# Patient Record
Sex: Male | Born: 1970 | Race: White | Hispanic: No | Marital: Married | Smoking: Never smoker
Health system: Southern US, Community
[De-identification: ages and names within clinical notes are randomized; demographics above are authoritative.]

## PROBLEM LIST (undated history)

## (undated) DIAGNOSIS — K200 Eosinophilic esophagitis: Secondary | ICD-10-CM

## (undated) DIAGNOSIS — F32A Depression, unspecified: Secondary | ICD-10-CM

## (undated) DIAGNOSIS — K222 Esophageal obstruction: Secondary | ICD-10-CM

## (undated) DIAGNOSIS — K449 Diaphragmatic hernia without obstruction or gangrene: Secondary | ICD-10-CM

## (undated) DIAGNOSIS — K219 Gastro-esophageal reflux disease without esophagitis: Secondary | ICD-10-CM

## (undated) DIAGNOSIS — I10 Essential (primary) hypertension: Secondary | ICD-10-CM

## (undated) DIAGNOSIS — F329 Major depressive disorder, single episode, unspecified: Secondary | ICD-10-CM

## (undated) HISTORY — DX: Esophageal obstruction: K22.2

## (undated) HISTORY — PX: UPPER GASTROINTESTINAL ENDOSCOPY: SHX188

## (undated) HISTORY — DX: Gastro-esophageal reflux disease without esophagitis: K21.9

## (undated) HISTORY — DX: Essential (primary) hypertension: I10

## (undated) HISTORY — PX: SURGERY SCROTAL / TESTICULAR: SUR1316

## (undated) HISTORY — DX: Eosinophilic esophagitis: K20.0

## (undated) HISTORY — DX: Diaphragmatic hernia without obstruction or gangrene: K44.9

## (undated) HISTORY — DX: Depression, unspecified: F32.A

## (undated) HISTORY — DX: Major depressive disorder, single episode, unspecified: F32.9

---

## 2003-05-21 ENCOUNTER — Encounter: Admission: RE | Admit: 2003-05-21 | Discharge: 2003-05-21 | Payer: Self-pay | Admitting: Family Medicine

## 2003-07-02 ENCOUNTER — Ambulatory Visit (HOSPITAL_COMMUNITY): Admission: RE | Admit: 2003-07-02 | Discharge: 2003-07-02 | Payer: Self-pay | Admitting: Urology

## 2003-07-02 ENCOUNTER — Encounter (INDEPENDENT_AMBULATORY_CARE_PROVIDER_SITE_OTHER): Payer: Self-pay | Admitting: Specialist

## 2003-07-02 ENCOUNTER — Ambulatory Visit (HOSPITAL_BASED_OUTPATIENT_CLINIC_OR_DEPARTMENT_OTHER): Admission: RE | Admit: 2003-07-02 | Discharge: 2003-07-02 | Payer: Self-pay | Admitting: Urology

## 2004-11-21 ENCOUNTER — Ambulatory Visit: Payer: Self-pay | Admitting: Gastroenterology

## 2004-12-29 IMAGING — CT CT ABDOMEN WO/W CM
1 of 2 series · 14 of 32 positions shown, 19 images · IV contrast (omnipaque)
Comparison: none

CLINICAL DATA: Right flank and abdominal pain for several months.  Some constipation.  Contrast codes:  1LR.J and 493.0.  
 CT SCAN OF THE ABDOMEN WITHOUT AND WITH CONTRAST AND CT SCAN OF THE PELVIS WITH CONTRAST   
 Prior to the introduction of intravenous contrast, scans of the upper abdomen show the lower lung fields, liver, gallbladder, hepatic radicals, common bile duct, pancreas, and spleen to be normal.  The adrenal glands, kidneys, and upper ureters appear normal.  Oral contrast has passed throughout the stomach and small bowel into the left colon.  
 The patient was then injected intravenously with 125 ml of Omnipaque 300 and the entire abdomen was then rescanned and showed the liver to be normal in its degree of enhancement.  No abnormal lymph nodes are seen.   The major vessels appear normal.   The kidneys appear normal as do the upper ureters.  
 IMPRESSION
 Normal CT scan of the abdomen without and with intravenous contrast. 
 A series of scans of the pelvis utilizing the same contrast as given for the abdomen are made and show the appendix to be slightly prominent in size without definite inflammation, abscess, or perforation.  The appendix is best seen on image 47 and image 112.  There are no definite abnormal lymph nodes seen.  There are some incompletely opacified loops of small bowel noted but no obstruction, perforation or abscess is seen.  The oral contrast is passed throughout the bowel to the sigmoid colon.  The bladder, distal ureters, and seminal vesicals appear normal.  The pelvis and lower lumbar spine are within normal limits. 
 The appendix is minimally prominent in thickness with a maximum of 9 mm in diameter.  No phlebolith or definite infection is seen.  No free fluid or air is present.  
 This report was called to the office of Dr. Adhuham Maldives and was given to Dankabenko Ibaruri.

[Series 3: routine abdomen · axial · 0.70mm/px · z∈[-350,-10]mm · 14 of 106 slices shown, 19 images]
[im 6/106  soft-tissue]
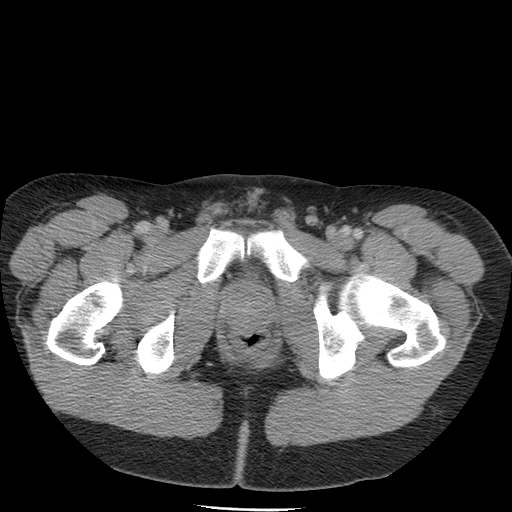
[im 6/106  bone]
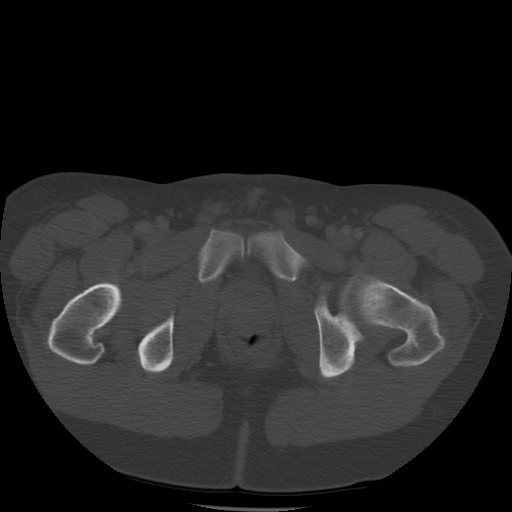
[im 16/106  soft-tissue]
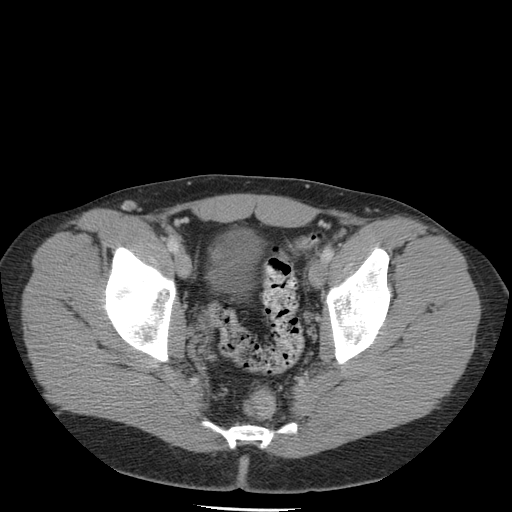
[im 22/106  soft-tissue]
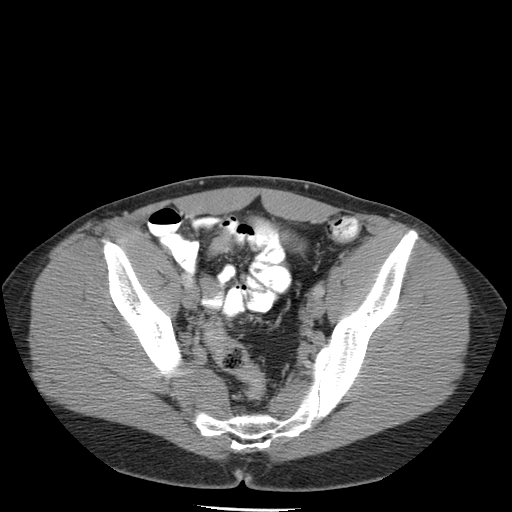
[im 32/106  soft-tissue]
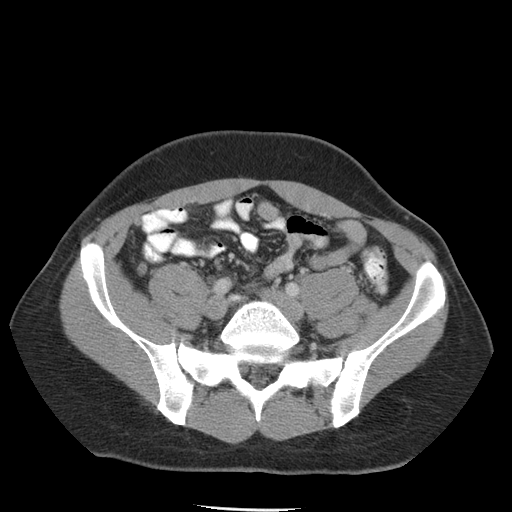
[im 37/106  soft-tissue]
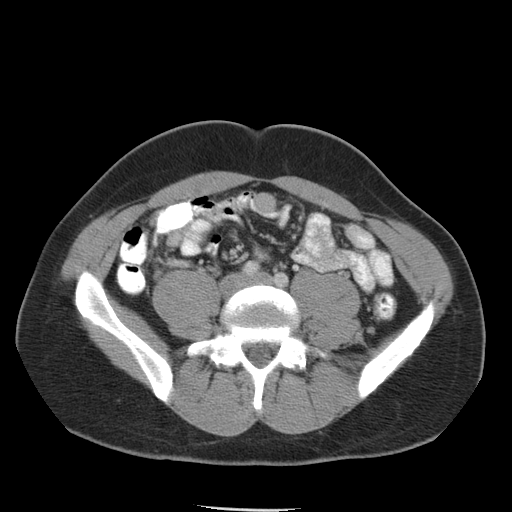
[im 48/106  soft-tissue]
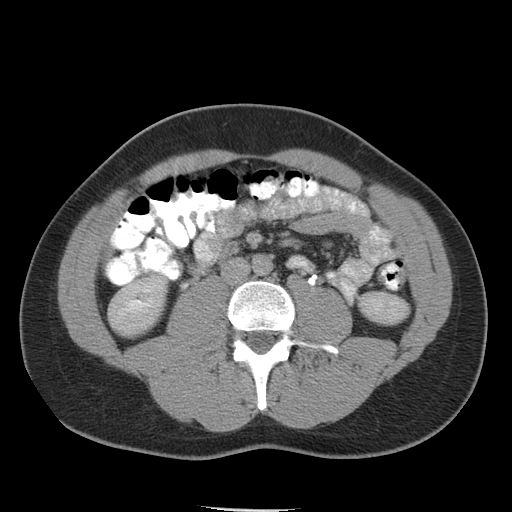
[im 53/106  soft-tissue]
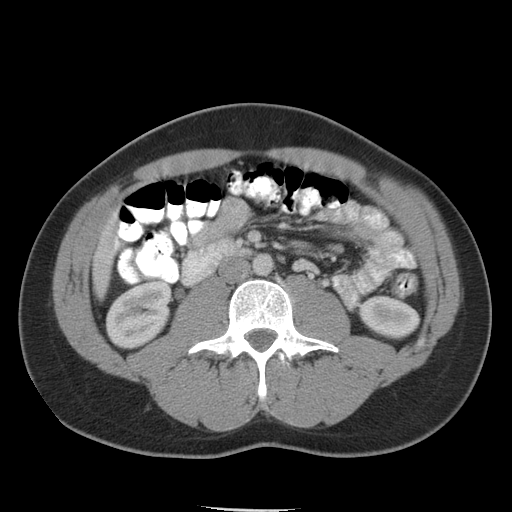
[im 58/106  soft-tissue]
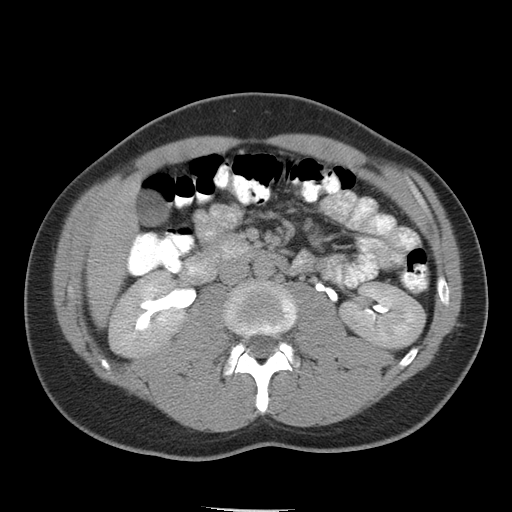
[im 69/106  soft-tissue]
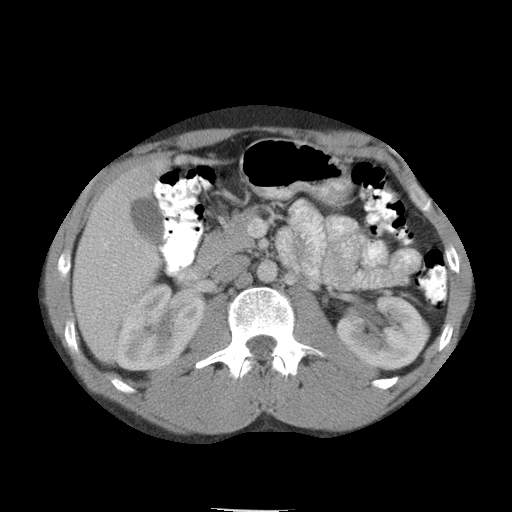
[im 69/106  bone]
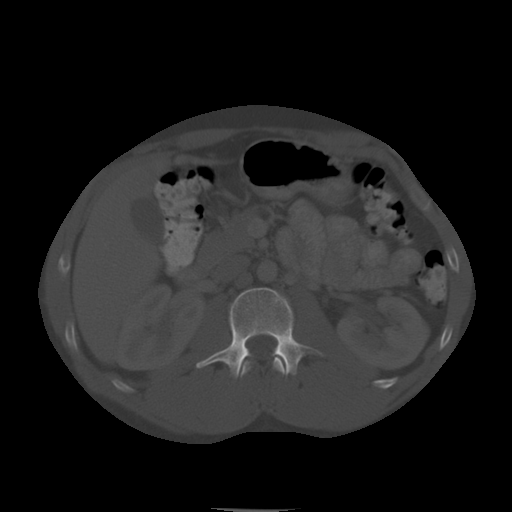
[im 74/106  soft-tissue]
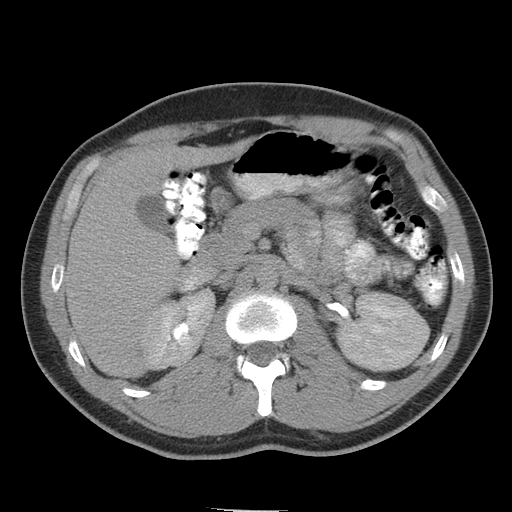
[im 85/106  soft-tissue]
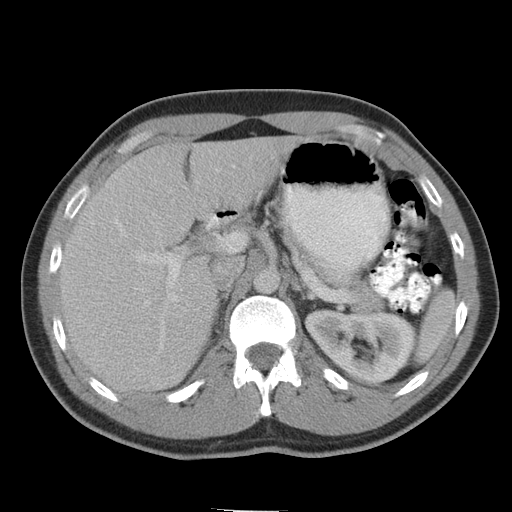
[im 85/106  lung]
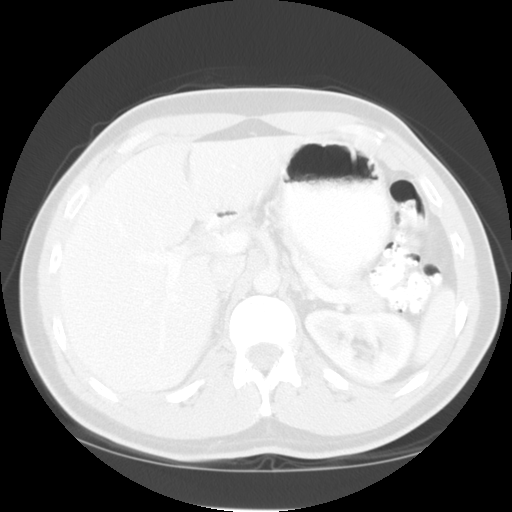
[im 90/106  soft-tissue]
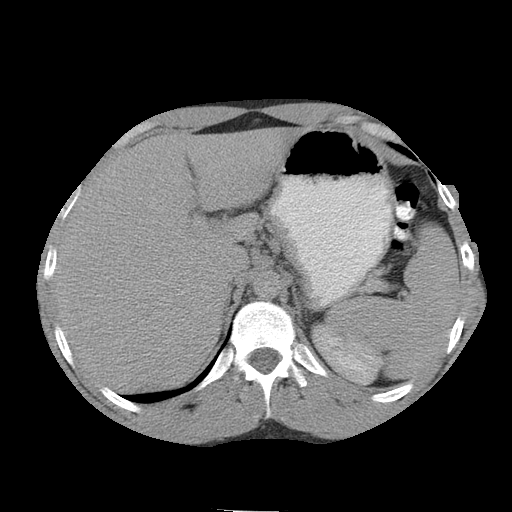
[im 90/106  lung]
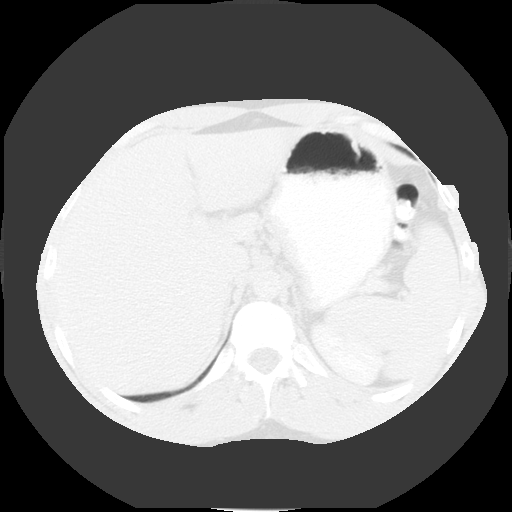
[im 95/106  lung]
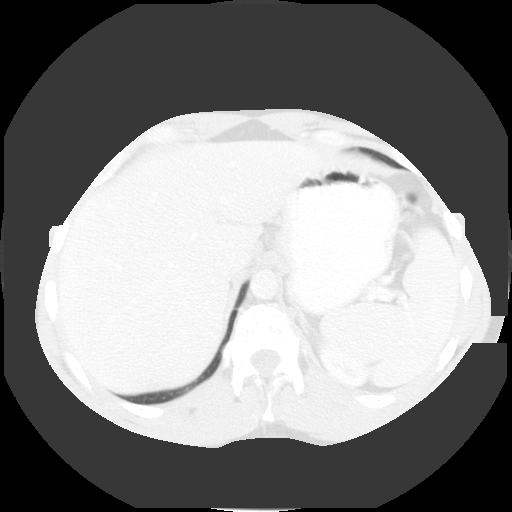
[im 100/106  soft-tissue]
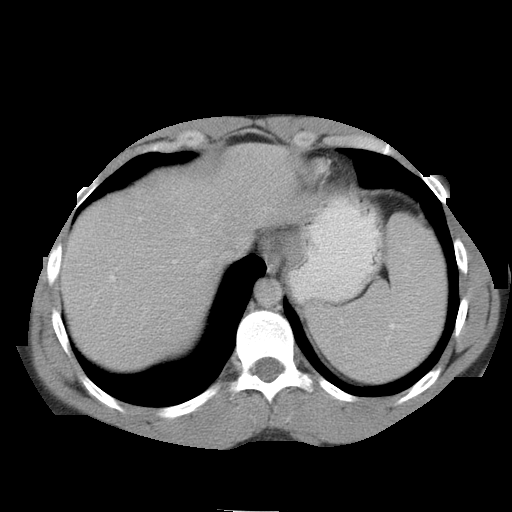
[im 100/106  lung]
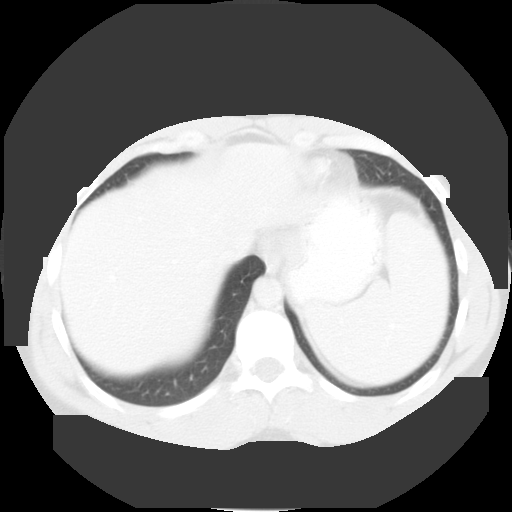

[14 of 32 positions shown; findings below may reference images not displayed]

## 2005-03-11 ENCOUNTER — Ambulatory Visit: Payer: Self-pay | Admitting: Family Medicine

## 2005-12-30 ENCOUNTER — Ambulatory Visit: Payer: Self-pay | Admitting: Gastroenterology

## 2006-03-26 ENCOUNTER — Ambulatory Visit: Payer: Self-pay | Admitting: Family Medicine

## 2007-02-17 DIAGNOSIS — J45909 Unspecified asthma, uncomplicated: Secondary | ICD-10-CM

## 2007-02-25 ENCOUNTER — Ambulatory Visit: Payer: Self-pay | Admitting: Family Medicine

## 2007-02-25 DIAGNOSIS — F329 Major depressive disorder, single episode, unspecified: Secondary | ICD-10-CM

## 2007-02-25 DIAGNOSIS — R030 Elevated blood-pressure reading, without diagnosis of hypertension: Secondary | ICD-10-CM | POA: Insufficient documentation

## 2007-03-01 ENCOUNTER — Ambulatory Visit: Payer: Self-pay | Admitting: Family Medicine

## 2007-03-01 DIAGNOSIS — J069 Acute upper respiratory infection, unspecified: Secondary | ICD-10-CM | POA: Insufficient documentation

## 2007-04-01 ENCOUNTER — Ambulatory Visit: Payer: Self-pay | Admitting: Gastroenterology

## 2007-04-20 ENCOUNTER — Ambulatory Visit: Payer: Self-pay | Admitting: Family Medicine

## 2007-04-20 DIAGNOSIS — E669 Obesity, unspecified: Secondary | ICD-10-CM | POA: Insufficient documentation

## 2007-04-20 DIAGNOSIS — F519 Sleep disorder not due to a substance or known physiological condition, unspecified: Secondary | ICD-10-CM | POA: Insufficient documentation

## 2007-04-21 ENCOUNTER — Encounter (INDEPENDENT_AMBULATORY_CARE_PROVIDER_SITE_OTHER): Payer: Self-pay | Admitting: Internal Medicine

## 2007-04-21 DIAGNOSIS — F911 Conduct disorder, childhood-onset type: Secondary | ICD-10-CM | POA: Insufficient documentation

## 2007-06-02 ENCOUNTER — Ambulatory Visit: Payer: Self-pay | Admitting: Family Medicine

## 2007-06-02 DIAGNOSIS — R21 Rash and other nonspecific skin eruption: Secondary | ICD-10-CM | POA: Insufficient documentation

## 2007-06-02 DIAGNOSIS — J309 Allergic rhinitis, unspecified: Secondary | ICD-10-CM | POA: Insufficient documentation

## 2007-06-16 ENCOUNTER — Encounter (INDEPENDENT_AMBULATORY_CARE_PROVIDER_SITE_OTHER): Payer: Self-pay | Admitting: Internal Medicine

## 2007-07-01 ENCOUNTER — Ambulatory Visit: Payer: Self-pay | Admitting: Licensed Clinical Social Worker

## 2007-07-15 ENCOUNTER — Ambulatory Visit: Payer: Self-pay | Admitting: Licensed Clinical Social Worker

## 2007-07-22 ENCOUNTER — Telehealth: Payer: Self-pay | Admitting: Gastroenterology

## 2007-07-29 ENCOUNTER — Ambulatory Visit: Payer: Self-pay | Admitting: Licensed Clinical Social Worker

## 2007-08-12 ENCOUNTER — Ambulatory Visit: Payer: Self-pay | Admitting: Licensed Clinical Social Worker

## 2007-08-17 ENCOUNTER — Telehealth: Payer: Self-pay | Admitting: Gastroenterology

## 2008-03-30 ENCOUNTER — Ambulatory Visit: Payer: Self-pay | Admitting: Family Medicine

## 2008-05-04 ENCOUNTER — Ambulatory Visit: Payer: Self-pay | Admitting: Family Medicine

## 2008-05-08 ENCOUNTER — Telehealth (INDEPENDENT_AMBULATORY_CARE_PROVIDER_SITE_OTHER): Payer: Self-pay | Admitting: Internal Medicine

## 2009-07-09 ENCOUNTER — Telehealth: Payer: Self-pay | Admitting: Gastroenterology

## 2010-04-15 NOTE — Progress Notes (Signed)
Summary: Schedule recall office visit   Phone Note Outgoing Call Call back at [facility] Phone [phone]   Call placed by: Christie Nottingham CMA Duncan Dull),  July 09, 2009 3:04 PM Call placed to: Patient Summary of Call: left message for pt  to call back and schedule recall office visit.  Initial call taken by: Christie Nottingham CMA Duncan Dull),  July 09, 2009 3:05 PM  Follow-up for Phone Call        left message for pt  to call back and schedule recall office visit. Follow-up by: Christie Nottingham CMA Duncan Dull),  Jul 16, 2009 3:23 PM

## 2010-07-29 NOTE — Assessment & Plan Note (Signed)
[facility]                         GASTROENTEROLOGY OFFICE NOTE   NAME:Corp, JERRARD BRADBURN                       MRN:          [id number]  DATE:04/01/2007                            DOB:          1970-06-22    This is a return office visit for GERD complicated by peptic stricture.  Mr. Mojica relates no complaints.  His reflux symptoms are under  excellent control.  He has had no recurrent episodes of dysphagia.   CURRENT MEDICATIONS:  1. Zoloft 50 mg q. day.  2. Nexium 40 mg q. day.   MEDICATION ALLERGIES:  Amoxicillin leading to a rash.   EXAMINATION:  No acute distress.  Weight:  216.6 pounds.  Blood  pressure:  138/88.  Pulse:  80, regular.  CHEST:  Clear to auscultation bilaterally.  CARDIAC:  Regular rate and rhythm without murmurs.  ABDOMEN:  Soft and nontender with normoactive bowel sounds.  No palpable  organomegaly, masses or hernias.   ASSESSMENT/PLAN:  1. Gastrointestinal reflux disease with a history of a peptic      stricture.  Symptoms under excellent control.  Renew Nexium 40 mg      p.o. q.a.m. taken  30 minutes before breakfast.  2. Return office visit one year.     Venita Lick. Russella Dar, MD, [facility]  Electronically Signed    MTS/MedQ  DD: 04/05/2007  DT: 04/05/2007  Job #: [id number]

## 2010-08-01 NOTE — Op Note (Signed)
NAME:  Dustin Nichols, Dustin Nichols                          ACCOUNT NO.:  [id number]   MEDICAL RECORD NO.:  [id number]                   PATIENT TYPE:  AMB   LOCATION:  [facility]                                 FACILITY:  [facility]   PHYSICIAN:  Sigmund I. Patsi Sears, M.D.         DATE OF BIRTH:  09-13-1970   DATE OF PROCEDURE:  07/02/2003  DATE OF DISCHARGE:                                 OPERATIVE REPORT   PREOPERATIVE DIAGNOSIS:  Left varicocele.   POSTOPERATIVE DIAGNOSIS:  Left varicocele.   PROCEDURE PERFORMED:  Left varicocele ligation.   SURGEON:  Jethro Bolus, MD   ASSISTANT:  Thyra Breed, MD   ANESTHESIA:  General endotracheal.   DRAINS:  None.   ESTIMATED BLOOD LOSS:  10 mL.   INDICATIONS FOR PROCEDURE:  Dustin Nichols is a very pleasant 40 year old male,  seen for a work-up of infertility.  On physical exam, he was found to have a  sizeable left varicocele.  Dustin Nichols was counseled on the premise that this  varicocele may be a factor related to his infertility.  All the risks,  benefits, and alternatives of procedure to repair this were discussed with  the patient.  The patient understands the risk of an internal spermatic vein  ligation and informed consent is obtained.   PROCEDURE IN DETAIL:  Following identification by his arm bracelet, the  patient was brought to the operating room and placed in the supine position.  He then underwent successful induction of general endotracheal anesthesia  and received preoperative IV antibiotics.  His lower abdomen was then  shaved, prepped with Betadine, and draped in the usual sterile fashion.  An  initial incision was made with a 15 blade scalpel just overlying the  superficial inguinal ring.  The incision was approximately 3 cm long.  This  incision was then carried down through the subcutaneous tissue using Bovie  electrocautery.  Scarpa's fascia was then opened with Metzenbaum scissors.  Two [employer] retractors were then used to bluntly  dissect any remaining  subcutaneous fat from the overlying cord.  At this point, the cord was  easily visualized exiting the superficial inguinal ring.  All lateral  attachments including the external spermatic fascia were then dissected off  using right-angle and Bovie electrocautery.  A right-angle clamp was then  easily passed beneath all cord structures.  This was delivered from the  wound.  A quarter-inch Penrose drain was then passed beneath the cord and  hemostats used to affix on either side of the wound, thus elevating the cord  for the remainder of the procedure.  Using two DeBakey forceps, the cord  structures were carefully separated and dissected out.  The vas deferens was  then isolated and moved laterally and then included in the Penrose holding  apparatus.  The large internal spermatic veins were then isolated and  careful dissection was employed until again a right-angle clamp could be  passed underneath each  vein.  The veins were then ligated, both proximally  and distally with an intervening 1 cm segment.  The suture used for the  ligation was a 3-0 silk.  The intervening vein was then sent for pathologic  analysis.  This procedure was repeated for the two remaining internal  spermatic veins which were largely dilated.  Any remaining bleeding was  controlled with a needle-tip Bovie.  Once we were assured that excellent  hemostasis was obtained, the Penrose drain was removed from beneath the cord  and the cord allowed to retract back in its natural position.  Again, the  superficial inguinal ring was not opened and no repair necessary.  We then  irrigated the wound and, again, any remaining bleeding was controlled with  Bovie electrocautery.  Excellent hemostasis had been obtained.  We then  closed the Scarpa's fascia and deep subcutaneous tissues with running 2-0  Vicryl suture.  A second layer of 2-0 Vicryl suture was then used to  reapproximate the remaining  subcutaneous tissues as well as the dermis.  Surgical clips were then used to reapproximate the skin.  The wound was then  cleaned and dried.  A sterile gauze was then applied on top of the surgical  clips followed by Tegaderm dressing.  This marked termination of the  procedure.  The patient tolerated the procedure well, and there were no  complications.  For a full summary, please note that Dr. Patsi Sears was  present and participated in the entire procedure as he was the responsible  surgeon.   DISPOSITION:  After awaking from general anesthesia, the patient was  transported to the postanesthesia care unit in stable condition.  From here,  he would be discharged to home with a follow up appointment scheduled for  staple removal.     Thyra Breed, MD                            Sigmund I. Patsi Sears, M.D.    EG/MEDQ  D:  07/02/2003  T:  07/03/2003  Job:  [id number]

## 2010-08-01 NOTE — Assessment & Plan Note (Signed)
[facility]                           GASTROENTEROLOGY OFFICE NOTE   NAME:Dustin Nichols, Dustin Nichols                       MRN:          [id number]  DATE:12/30/2005                            DOB:          05/19/1970    Return office visit for GERD, complicated by peptic stricture.   Mr. Dustin Nichols feels his symptoms are under good, but not very good control. He  does have frequent breakthrough heartburn despite using his Protonix  regularly and generally following anti-reflux measures. He has very rare  intermittent episodes of very brief dysphagia. These symptoms have not  progressed in severity over the past year.   CURRENT MEDICATIONS:  Protonix 40 mg p.o. q day.   MEDICATION ALLERGIES:  AMOXICILLIN.   PHYSICAL EXAMINATION:  In no acute distress. Weight: 202 pounds. Blood  pressure: 128/70. Pulse: 84 and regular.  CHEST: Clear to auscultation bilaterally.  CARDIAC: Regular rate and rhythm without murmurs.  ABDOMEN: Soft and nontender with normoactive bowel sounds.   ASSESSMENT/PLAN:  Gastroesophageal reflux disease with a history of a peptic  stricture. His dysphagia is mild and stable and he does not want to proceed  with upper endoscopy at this time. If his symptoms worsen he will call or  consideration of repeat upper endoscopy with dilation. Will try alternative  proton pump inhibitors to see if he can achieve better symptomatic control.  He is given samples of Nexium and AcipHex to try each for approximately one  week and he will contact our office and let us know his preference. Return  office visit in one year.       Venita Lick. Russella Dar, MD, [facility]      MTS/MedQ  DD:  01/05/2006  DT:  01/05/2006  Job #:  [id number]

## 2011-01-02 ENCOUNTER — Encounter: Payer: Self-pay | Admitting: Gastroenterology

## 2011-01-19 ENCOUNTER — Ambulatory Visit (INDEPENDENT_AMBULATORY_CARE_PROVIDER_SITE_OTHER): Payer: BC Managed Care – PPO | Admitting: Gastroenterology

## 2011-01-19 ENCOUNTER — Encounter: Payer: Self-pay | Admitting: Gastroenterology

## 2011-01-19 VITALS — BP 136/100 | HR 60 | Ht 69.0 in | Wt 216.8 lb

## 2011-01-19 DIAGNOSIS — K219 Gastro-esophageal reflux disease without esophagitis: Secondary | ICD-10-CM

## 2011-01-19 DIAGNOSIS — R1319 Other dysphagia: Secondary | ICD-10-CM

## 2011-01-19 NOTE — Progress Notes (Signed)
History of Present Illness: This is a 40 year old male who I have seen in the past with a history of GERD and an esophageal stricture. He states he had upper endoscopies with dilations twice in the past. Unfortunately I cannot locate his endoscopy records at this time. Office visit from 2007 and 2009 were reviewed where he was maintained on PPI therapy for his prior history of GERD and strictures. His reflux symptoms were well-controlled without dysphagia in 2007 and 2009. He states that over the past 2 months he has had several episodes of solid food dysphagia. Denies weight loss, abdominal pain, constipation, diarrhea, change in stool caliber, melena, hematochezia, nausea, vomiting, reflux symptoms, chest pain.  Review of Systems: Pertinent positive and negative review of systems were noted in the above HPI section. All other review of systems were otherwise negative.  Current Medications, Allergies, Past Medical History, Past Surgical History, Family History and Social History were reviewed in [employer] record.  Physical Exam: General: Well developed , well nourished, no acute distress Head: Normocephalic and atraumatic Eyes:  sclerae anicteric, EOMI Ears: Normal auditory acuity Mouth: No deformity or lesions Neck: Supple, no masses or thyromegaly Lungs: Clear throughout to auscultation Heart: Regular rate and rhythm; no murmurs, rubs or bruits Abdomen: Soft, non tender and non distended. No masses, hepatosplenomegaly or hernias noted. Normal Bowel sounds Musculoskeletal: Symmetrical with no gross deformities  Skin: No lesions on visible extremities Pulses:  Normal pulses noted Extremities: No clubbing, cyanosis, edema or deformities noted Neurological: Alert oriented x 4, grossly nonfocal Cervical Nodes:  No significant cervical adenopathy Inguinal Nodes: No significant inguinal adenopathy Psychological:  Alert and cooperative. Normal mood and affect  Assessment  and Recommendations:  1. Dysphasia, history of an esophageal stricture and GERD. Continue Prilosec 20 mg daily and standard antireflux measures. Attempt to obtain his prior records. Schedule upper endoscopy with dilation. The risks, benefits, and alternatives to endoscopy with possible biopsy and possible dilation were discussed with the patient and they consent to proceed.   2. Colorectal cancer screening-average risk. Colonoscopy at age 65 in June 2021.

## 2011-01-19 NOTE — Patient Instructions (Signed)
You have been scheduled for a Endoscopy with [facility]. See separate instructions. cc: Kerby Nora, MD

## 2011-01-20 ENCOUNTER — Encounter: Payer: Self-pay | Admitting: Gastroenterology

## 2011-01-30 ENCOUNTER — Ambulatory Visit (AMBULATORY_SURGERY_CENTER): Payer: BC Managed Care – PPO | Admitting: Gastroenterology

## 2011-01-30 ENCOUNTER — Encounter: Payer: Self-pay | Admitting: Gastroenterology

## 2011-01-30 DIAGNOSIS — R1319 Other dysphagia: Secondary | ICD-10-CM

## 2011-01-30 DIAGNOSIS — K200 Eosinophilic esophagitis: Secondary | ICD-10-CM

## 2011-01-30 DIAGNOSIS — K219 Gastro-esophageal reflux disease without esophagitis: Secondary | ICD-10-CM

## 2011-01-30 MED ORDER — SODIUM CHLORIDE 0.9 % IV SOLN: 500.0000 mL | INTRAVENOUS | Status: DC

## 2011-01-30 NOTE — Patient Instructions (Addendum)
Please follow the ESOPHAGEAL DILATION DIET as follows:  -Nothing by mouth until 3:30pm  -Clear Liquids for 1 hour 3:30pm-4:30pm  -Soft foods for the rest of today 4:30pm until tomorrow  Esophageal Stricture The esophagus is the long, narrow tube which carries food and liquid from the mouth to the stomach. Sometimes a part of the esophagus becomes narrow and makes it difficult, painful, or even impossible to swallow. This is called an esophageal stricture.  CAUSES  Common causes of blockage or strictures of the esophagus are:  Exposure of the lower esophagus to the acid from the stomach may cause narrowing.   Hiatal hernia in which a small part of the stomach bulges up through the diaphragm can cause a narrowing in the bottom of the esophagus.   Scleroderma is a tissue disorder that affects the esophagus and makes swallowing difficult.   Achalasia is an absence of nerves in the lower esophagus and to the esophageal sphincter. This absence of nerves may be congenital (present since birth). This can cause irregular spasms which do not allow food and fluid through.   Strictures may develop from swallowing materials which damage the esophagus. Examples are acids or alkalis such as lye.   Schatzki's Ring is a narrow ring of non-cancerous tissue which narrows the lower esophagus. The cause of this is unknown.   Growths can block the esophagus.  SYMPTOMS  Some of the problems are difficulty swallowing or pain with swallowing. DIAGNOSIS  Your caregiver often suspects this problem by taking a medical history. They will also do a physical exam. They may then take X-rays and/or perform an endoscopy. Endoscopy is an exam in which a tube like a small flexible telescope is used to look at your esophagus.  TREATMENT  One form of treatment is to dilate the narrow area. This means to stretch it.   When this is not successful, chest surgery may be required. This is a much more extensive form of treatment  with a longer recovery time.  Both of the above treatments make the passage of food and water into the stomach easier. They also make it easier for stomach contents to bubble back into the esophagus. Special medications may be used following the procedure to help prevent further narrowing. Medications may be used to lower the amount of acid in the stomach juice.  SEEK IMMEDIATE MEDICAL CARE IF:   Your swallowing is becoming more painful, difficult, or you are unable to swallow.   You vomit up blood.   You develop black tarry stools.   You develop chills.   You have a fever.   You develop chest or abdominal pain.   You develop shortness of breath, feel lightheaded, or faint.  Follow up with medical care as your caregiver suggests. Document Released: 11/10/2005 Document Revised: 11/12/2010 Document Reviewed: 12/17/2005 [facility] Patient Information 2012 [place], [place].  Hiatal Hernia A hiatal hernia occurs when a part of the stomach slides above the diaphragm. The diaphragm is the thin muscle separating the belly (abdomen) from the chest. A hiatal hernia can be something you are born with or develop over time. Hiatal hernias may allow stomach acid to flow back into your esophagus, the tube which carries food from your mouth to your stomach. If this acid causes problems it is called GERD (gastro-esophageal reflux disease).  SYMPTOMS  Common symptoms of GERD are heartburn (burning in your chest). This is worse when lying down or bending over. It may also cause belching and indigestion. Some  of the things which make GERD worse are:  Increased weight pushes on stomach making acid rise more easily.   Smoking markedly increases acid production.   Alcohol decreases lower esophageal sphincter pressure (valve between stomach and esophagus), allowing acid from stomach into esophagus.   Late evening meals and going to bed with a full stomach increases pressure.   Anything that causes an increase  in acid production.   Lower esophageal sphincter incompetence.  DIAGNOSIS  Hiatal hernia is often diagnosed with x-rays of your stomach and small bowel. This is called an UGI (upper gastrointestinal x-ray). Sometimes a gastroscopic procedure is done. This is a procedure where your caregiver uses a flexible instrument to look into the stomach and small bowel. HOME CARE INSTRUCTIONS   Try to achieve and maintain an ideal body weight.   Avoid drinking alcoholic beverages.   Stop smoking.   Put the head of your bed on 4 to 6 inch blocks. This will keep your head and esophagus higher than your stomach. If you cannot use blocks, sleep with several pillows under your head and shoulders.   Over-the-counter medications will decrease acid production. Your caregiver can also prescribe medications for this. Take as directed.   1/2 to 1 teaspoon of an antacid taken every hour while awake, with meals and at bedtime, will neutralize acid.   Do not take aspirin, ibuprofen (Advil or Motrin), or other nonsteroidal anti-inflammatory drugs.   Do not wear tight clothing around your chest or stomach.   Eat smaller meals and eat more frequently. This keeps your stomach from getting too full. Eat slowly.   Do not lie down for 2 or 3 hours after eating. Do not eat or drink anything 1 to 2 hours before going to bed.   Avoid caffeine beverages (colas, coffee, cocoa, tea), fatty foods, citrus fruits and all other foods and drinks that contain acid and that seem to increase the problems.   Avoid bending over, especially after eating. Also avoid straining during bowel movements or when urinating or lifting things. Anything that increases the pressure in your belly increases the amount of acid that may be pushed up into your esophagus.  SEEK IMMEDIATE MEDICAL CARE IF:  There is change in location (pain in arms, neck, jaw, teeth or back) of your pain, or the pain is getting worse.   You also experience nausea,  vomiting, sweating (diaphoresis), or shortness of breath.   You develop continual vomiting, vomit blood or coffee ground material, have bright red blood in your stools, or have black tarry stools.  Some of these symptoms could signal other problems such as heart disease. MAKE SURE YOU:   Understand these instructions.   Monitor your condition.   Contact your caregiver if you are not doing well or are getting worse.  Document Released: 05/23/2003 Document Revised: 11/12/2010 Document Reviewed: 03/02/2005 [facility] Patient Information 2012 [place], [place].

## 2011-02-02 ENCOUNTER — Telehealth: Payer: Self-pay | Admitting: *Deleted

## 2011-02-02 NOTE — Telephone Encounter (Signed)
Follow up Call- Patient questions:  Do you have a fever, pain , or abdominal swelling? no Pain Score  0 *  Have you tolerated food without any problems? yes  Have you been able to return to your normal activities? yes  Do you have any questions about your discharge instructions: Diet   no Medications  no Follow up visit  no  Do you have questions or concerns about your Care? no  Actions: * If pain score is 4 or above: No action needed, pain <4.  Patient states that he does not have pain unless he swallows, when food passes through dilated are, he feels some discomfort. States that this sensation is getting better each day. No longer experiencing dysphagia. Bowel movements have remained normal in appearance. No weakness, is feeling great otherwise. Talked with patient about procedure and explained that these feelings are normal, but to call us if symptoms don't continue to improve, or if he starts to feel  worse.

## 2011-02-03 ENCOUNTER — Encounter: Payer: Self-pay | Admitting: Gastroenterology

## 2011-02-16 ENCOUNTER — Other Ambulatory Visit: Payer: Self-pay | Admitting: Gastroenterology

## 2011-02-16 ENCOUNTER — Ambulatory Visit (INDEPENDENT_AMBULATORY_CARE_PROVIDER_SITE_OTHER): Payer: BC Managed Care – PPO | Admitting: Gastroenterology

## 2011-02-16 ENCOUNTER — Encounter: Payer: Self-pay | Admitting: Gastroenterology

## 2011-02-16 VITALS — BP 144/98 | HR 64 | Ht 69.0 in | Wt 215.0 lb

## 2011-02-16 DIAGNOSIS — K200 Eosinophilic esophagitis: Secondary | ICD-10-CM

## 2011-02-16 MED ORDER — BUDESONIDE 0.25 MG/2ML IN SUSP: RESPIRATORY_TRACT | Status: DC

## 2011-02-16 MED ORDER — OMEPRAZOLE 20 MG PO CPDR: 20.0000 mg | DELAYED_RELEASE_CAPSULE | Freq: Every day | ORAL | Status: DC

## 2011-02-16 NOTE — Progress Notes (Signed)
History of Present Illness: This is a 40 year old white male returning for followup of dysphasia and eosinophilic esophagitis. His dysphasia improved substantially following dilation. He still notes a frequent burning pain with swallowing.  Current Medications, Allergies, Past Medical History, Past Surgical History, Family History and Social History were reviewed in [employer] record.  Physical Exam: General: Well developed , well nourished, no acute distress Head: Normocephalic and atraumatic Eyes:  sclerae anicteric, EOMI Ears: Normal auditory acuity Mouth: No deformity or lesions Lungs: Clear throughout to auscultation Heart: Regular rate and rhythm; no murmurs, rubs or bruits Abdomen: Soft, non tender and non distended. No masses, hepatosplenomegaly or hernias noted. Extremities: No clubbing, cyanosis, edema or deformities noted Neurological: Alert oriented x 4, grossly nonfocal Psychological:  Alert and cooperative. Normal mood and affect  Assessment and Recommendations:  1. Eosinophilic esophagitis. We discussed the disease and its treatment. I answered his questions to his satisfaction. Begin fluticasone inhaler 440 mcg swallowed twice a day or liquid budesonide 2 mg twice a day. He may have underlying GERD as well so will continue omeprazole 20 mg twice daily. Return office visit 2 months. Consider discontinuing treatment for eosinophilic esophagitis and retreating if needed if he has had a good clinical response at 2 months.

## 2011-02-16 NOTE — Patient Instructions (Signed)
We will contact you regarding your prescription to treat Eosinophilic Esophagitis.  cc: Kerby Nora, MD

## 2017-12-15 ENCOUNTER — Encounter: Payer: Self-pay | Admitting: Gastroenterology

## 2017-12-20 ENCOUNTER — Other Ambulatory Visit: Payer: Self-pay

## 2017-12-20 ENCOUNTER — Ambulatory Visit: Payer: 59 | Admitting: Gastroenterology

## 2017-12-20 ENCOUNTER — Encounter (INDEPENDENT_AMBULATORY_CARE_PROVIDER_SITE_OTHER): Payer: Self-pay

## 2017-12-20 ENCOUNTER — Encounter: Payer: Self-pay | Admitting: Gastroenterology

## 2017-12-20 VITALS — BP 120/70 | HR 72 | Ht 68.0 in | Wt 193.0 lb

## 2017-12-20 DIAGNOSIS — R1310 Dysphagia, unspecified: Secondary | ICD-10-CM | POA: Diagnosis not present

## 2017-12-20 DIAGNOSIS — K222 Esophageal obstruction: Secondary | ICD-10-CM

## 2017-12-20 DIAGNOSIS — K200 Eosinophilic esophagitis: Secondary | ICD-10-CM | POA: Diagnosis not present

## 2017-12-20 DIAGNOSIS — T18128A Food in esophagus causing other injury, initial encounter: Secondary | ICD-10-CM | POA: Insufficient documentation

## 2017-12-20 MED ORDER — BUDESONIDE 180 MCG/ACT IN AEPB: INHALATION_SPRAY | RESPIRATORY_TRACT | 1 refills | Status: DC

## 2017-12-20 MED ORDER — ONDANSETRON 4 MG PO TBDP: 4.0000 mg | ORAL_TABLET | Freq: Three times a day (TID) | ORAL | 1 refills | Status: DC | PRN

## 2017-12-20 MED ORDER — BUDESONIDE 180 MCG/ACT IN AEPB: 2.0000 | INHALATION_SPRAY | Freq: Two times a day (BID) | RESPIRATORY_TRACT | 1 refills | Status: DC

## 2017-12-20 NOTE — Addendum Note (Signed)
Addended by: Zachary George on: 12/20/2017 01:50 PM   Modules accepted: Orders

## 2017-12-20 NOTE — Progress Notes (Signed)
Reviewed and agree with management plan.  Yovana Scogin T. Shaun Runyon, MD FACG 

## 2017-12-20 NOTE — Progress Notes (Signed)
12/20/2017 Dustin Nichols [id number] 09/06/1970   HISTORY OF PRESENT ILLNESS:  This is a pleasant 47 year old male who is a patient of Dr. Ardell Isaacs.  He was seen in 2012 for issues with swallowing.  Underwent endoscopy and was found to have a stricture at the GE junction and a small hiatal hernia.  Stricture was dilated with 16 and 17 mm Savary dilator.  He was then also found to have eosinophilic esophagitis on pathology when esophageal biopsies showed greater than 60 eosinophils per high-powered field.  He was then also placed on omeprazole 20 mg daily and fluticasone inhaler.  He never followed up after that.  He has really has not had any issues since that time until just recently over the past several months.  Then just last week he had food impaction with chicken.  Went to the emergency department at [facility] where chest x-ray showed no issues.  He was given 2 doses of glucagon and the food finally passed.  He is here today for follow-up of the ER visit.  Since that time he has been on liquids and very soft foods.  Has continued on his omeprazole 20 mg daily.  Feels like even with that he was getting occasional reflux issues.   Past Medical History:  Diagnosis Date  . Asthma   . Depression   . Eosinophilic esophagitis   . GERD (gastroesophageal reflux disease)   . Hiatal hernia   . Hypertension   . Peptic stricture of esophagus    Past Surgical History:  Procedure Laterality Date  . SURGERY SCROTAL / TESTICULAR     blockage  . UPPER GASTROINTESTINAL ENDOSCOPY      reports that he has never smoked. He has never used smokeless tobacco. He reports that he does not drink alcohol or use drugs. family history includes Diabetes in his father, maternal grandmother, and paternal grandmother; Heart attack in his maternal grandfather and paternal grandmother; Hypertension in his mother. Allergies  Allergen Reactions  . Amoxicillin     REACTION: Rash      Outpatient Encounter  Medications as of 12/20/2017  Medication Sig  . albuterol (PROVENTIL HFA;VENTOLIN HFA) 108 (90 BASE) MCG/ACT inhaler Inhale 2 puffs into the lungs every 4 (four) hours as needed.    . cetirizine (ZYRTEC) 10 MG tablet Take 10 mg by mouth daily.  Marland Kitchen omeprazole (PRILOSEC) 20 MG capsule Take 20 mg by mouth daily.  . [DISCONTINUED] budesonide (PULMICORT) 0.25 MG/2ML nebulizer solution 2 mg/ 10mL twice daily liquid suspension 90 day supply  . [DISCONTINUED] fexofenadine (ALLEGRA) 180 MG tablet Take 180 mg by mouth daily.    . [DISCONTINUED] omeprazole (PRILOSEC) 20 MG capsule Take 1 capsule (20 mg total) by mouth daily.  . [DISCONTINUED] ondansetron (ZOFRAN ODT) 4 MG disintegrating tablet Take 1 tablet (4 mg total) by mouth every 8 (eight) hours as needed for nausea or vomiting.   No facility-administered encounter medications on file as of 12/20/2017.      REVIEW OF SYSTEMS  : All other systems reviewed and negative except where noted in the History of Present Illness.   PHYSICAL EXAM: BP 120/70   Pulse 72   Ht 5\' 8"  (1.727 m)   Wt 193 lb (87.5 kg)   BMI 29.35 kg/m  General: Well developed white male in no acute distress Head: Normocephalic and atraumatic Eyes:  Sclerae anicteric,conjunctive pink. Ears: Normal auditory acuity Lungs: Clear throughout to auscultation; no increased WOB. Heart: Regular rate and rhythm; no  M/R/G. Abdomen: Soft, non-distended.  BS present.  Mild epigastric TTP Musculoskeletal: Symmetrical with no gross deformities  Skin: No lesions on visible extremities Extremities: No edema  Neurological: Alert oriented x 4, grossly non-focal Psychological:  Alert and cooperative. Normal mood and affect  ASSESSMENT AND PLAN: *47 year old male with history of esophageal stricture and EoE who presented last week to [facility] ED with food impaction, chicken.  Resolved with 2 doses of glucagon.  Will plan for EGD with possible dilation in a couple of weeks.  In the interim he  will continue his omeprazole 20 mg daily and I am going to start him on fluticasone inhaler 2 puffs twice daily to be swallowed (rinse mouth well after) to empirically treat EoE.  **The risks, benefits, and alternatives to EGD with possible dilation were discussed with the patient and he consents to proceed.   CC:  Excell Seltzer, MD

## 2017-12-20 NOTE — Patient Instructions (Signed)
If you are age 48 or older, your body mass index should be between 23-30. Your Body mass index is 29.35 kg/m. If this is out of the aforementioned range listed, please consider follow up with your Primary Care Provider.  If you are age 67 or younger, your body mass index should be between 19-25. Your Body mass index is 29.35 kg/m. If this is out of the aformentioned range listed, please consider follow up with your Primary Care Provider.   You have been scheduled for an endoscopy. Please follow written instructions given to you at your visit today. If you use inhalers (even only as needed), please bring them with you on the day of your procedure. Your physician has requested that you go to www.startemmi.com and enter the access code given to you at your visit today. This web site gives a general overview about your procedure. However, you should still follow specific instructions given to you by our office regarding your preparation for the procedure.  We have sent the following medications to your pharmacy for you to pick up at your convenience: Budesonide  Thank you for choosing me and  [facility].   Doug Sou, PA-C

## 2017-12-31 ENCOUNTER — Ambulatory Visit (AMBULATORY_SURGERY_CENTER): Payer: 59 | Admitting: Gastroenterology

## 2017-12-31 ENCOUNTER — Encounter: Payer: Self-pay | Admitting: Gastroenterology

## 2017-12-31 VITALS — BP 116/74 | HR 59 | Temp 99.1°F | Resp 11 | Ht 68.0 in | Wt 193.0 lb

## 2017-12-31 DIAGNOSIS — R1310 Dysphagia, unspecified: Secondary | ICD-10-CM | POA: Diagnosis not present

## 2017-12-31 DIAGNOSIS — K222 Esophageal obstruction: Secondary | ICD-10-CM | POA: Diagnosis not present

## 2017-12-31 MED ORDER — SODIUM CHLORIDE 0.9 % IV SOLN: 500.0000 mL | Freq: Once | INTRAVENOUS | Status: DC

## 2017-12-31 NOTE — Patient Instructions (Addendum)
   Follow dilatation diet given to you today  Await biopsy results   YOU HAD AN ENDOSCOPIC PROCEDURE TODAY AT THE [facility]:   Refer to the procedure report that was given to you for any specific questions about what was found during the examination.  If the procedure report does not answer your questions, please call your gastroenterologist to clarify.  If you requested that your care partner not be given the details of your procedure findings, then the procedure report has been included in a sealed envelope for you to review at your convenience later.  YOU SHOULD EXPECT: Some feelings of bloating in the abdomen. Passage of more gas than usual.  Walking can help get rid of the air that was put into your GI tract during the procedure and reduce the bloating. If you had a lower endoscopy (such as a colonoscopy or flexible sigmoidoscopy) you may notice spotting of blood in your stool or on the toilet paper. If you underwent a bowel prep for your procedure, you may not have a normal bowel movement for a few days.  Please Note:  You might notice some irritation and congestion in your nose or some drainage.  This is from the oxygen used during your procedure.  There is no need for concern and it should clear up in a day or so.  SYMPTOMS TO REPORT IMMEDIATELY:     Following upper endoscopy (EGD)  Vomiting of blood or coffee ground material  New chest pain or pain under the shoulder blades  Painful or persistently difficult swallowing  New shortness of breath  Fever of 100F or higher  Black, tarry-looking stools  For urgent or emergent issues, a gastroenterologist can be reached at any hour by calling [phone].   DIET:  Follow dilatation diet today   ACTIVITY:  You should plan to take it easy for the rest of today and you should NOT DRIVE or use heavy machinery until tomorrow (because of the sedation medicines used during the test).    FOLLOW UP: Our staff will call  the number listed on your records the next business day following your procedure to check on you and address any questions or concerns that you may have regarding the information given to you following your procedure. If we do not reach you, we will leave a message.  However, if you are feeling well and you are not experiencing any problems, there is no need to return our call.  We will assume that you have returned to your regular daily activities without incident.  If any biopsies were taken you will be contacted by phone or by letter within the next 1-3 weeks.  Please call us at [phone] if you have not heard about the biopsies in 3 weeks.    SIGNATURES/CONFIDENTIALITY: You and/or your care partner have signed paperwork which will be entered into your electronic medical record.  These signatures attest to the fact that that the information above on your After Visit Summary has been reviewed and is understood.  Full responsibility of the confidentiality of this discharge information lies with you and/or your care-partner.

## 2017-12-31 NOTE — Op Note (Signed)
[facility] Patient Name: Dustin Nichols Procedure Date: 12/31/2017 2:34 PM MRN: [id number] Endoscopist: Meryl Dare , MD Age: 47 Referring MD:  Date of Birth: 12/22/1970 Gender: Male Account #: [id number] Procedure:                Upper GI endoscopy Indications:              Dysphagia, recent food impaction Medicines:                Monitored Anesthesia Care Procedure:                Pre-Anesthesia Assessment:                           - Prior to the procedure, a History and Physical                            was performed, and patient medications and                            allergies were reviewed. The patient's tolerance of                            previous anesthesia was also reviewed. The risks                            and benefits of the procedure and the sedation                            options and risks were discussed with the patient.                            All questions were answered, and informed consent                            was obtained. Prior Anticoagulants: The patient has                            taken no previous anticoagulant or antiplatelet                            agents. ASA Grade Assessment: II - A patient with                            mild systemic disease. After reviewing the risks                            and benefits, the patient was deemed in                            satisfactory condition to undergo the procedure.                           After obtaining informed consent, the endoscope was  passed under direct vision. Throughout the                            procedure, the patient's blood pressure, pulse, and                            oxygen saturations were monitored continuously. The                            Model GIF-HQ190 [id number]) scope was introduced                            through the mouth, and advanced to the second part                            of duodenum. The upper  GI endoscopy was                            accomplished without difficulty. The patient                            tolerated the procedure well. Scope In: Scope Out: Findings:                 Mucosal changes including ringed esophagus and                            longitudinal furrows were found in the mid                            esophagus and in the distal esophagus. Esophageal                            findings were graded using the Eosinophilic                            Esophagitis Endoscopic Reference Score (EoE-EREFS)                            as: Edema Grade 1 Present (decreased clarity or                            absence of vascular markings), Rings Grade 1 Mild                            (subtle circumferential ridges seen on esophageal                            distension), Exudates Grade 1 Mild (scattered white                            lesions involving less than 10 percent of the                            esophageal  surface area), Furrows Grade 1 Present                            (vertical lines with or without visible depth) and                            Stricture present. Biopsies were taken with a cold                            forceps for histology. A guidewire was placed and                            the scope was withdrawn. Dilations were performed                            with Savary dilators with mild resistance at 13 mm,                            14 mm. Heme on last dilator.                           The exam of the esophagus was otherwise normal.                           A small hiatal hernia was present.                           The exam of the stomach was otherwise normal.                           The duodenal bulb and second portion of the                            duodenum were normal. Complications:            No immediate complications. Estimated Blood Loss:     Estimated blood loss was minimal. Impression:               - Esophageal  mucosal changes consistent with                            eosinophilic esophagitis. Biopsied. Dilated.                           - Small hiatal hernia.                           - Normal duodenal bulb and second portion of the                            duodenum. Recommendation:           - Patient has a contact number available for                            emergencies. The signs  and symptoms of potential                            delayed complications were discussed with the                            patient. Return to normal activities tomorrow.                            Written discharge instructions were provided to the                            patient.                           - Clear liquid diet for 2 hours, then advance as                            tolerated to soft diet today.                           - Continue present medications.                           - Await pathology results.                           - Return to GI office in 6 weeks. Meryl Dare, MD 12/31/2017 2:59:20 PM This report has been signed electronically.

## 2017-12-31 NOTE — Progress Notes (Signed)
Report given to PACU, vss 

## 2017-12-31 NOTE — Progress Notes (Signed)
Called to room to assist during endoscopic procedure.  Patient ID and intended procedure confirmed with present staff. Received instructions for my participation in the procedure from the performing physician.  

## 2018-01-03 ENCOUNTER — Telehealth: Payer: Self-pay | Admitting: *Deleted

## 2018-01-03 ENCOUNTER — Telehealth: Payer: Self-pay

## 2018-01-03 NOTE — Telephone Encounter (Signed)
Follow up, left message on machine.

## 2018-01-03 NOTE — Telephone Encounter (Signed)
No answer for post procedure call back and no voicemail available. Sm 

## 2018-01-12 ENCOUNTER — Encounter: Payer: Self-pay | Admitting: Gastroenterology

## 2018-03-22 ENCOUNTER — Ambulatory Visit (INDEPENDENT_AMBULATORY_CARE_PROVIDER_SITE_OTHER): Payer: 59 | Admitting: Gastroenterology

## 2018-03-22 ENCOUNTER — Encounter: Payer: Self-pay | Admitting: Gastroenterology

## 2018-03-22 VITALS — BP 144/86 | HR 78 | Ht 68.0 in | Wt 190.4 lb

## 2018-03-22 DIAGNOSIS — K200 Eosinophilic esophagitis: Secondary | ICD-10-CM | POA: Diagnosis not present

## 2018-03-22 DIAGNOSIS — K219 Gastro-esophageal reflux disease without esophagitis: Secondary | ICD-10-CM | POA: Diagnosis not present

## 2018-03-22 MED ORDER — BUDESONIDE 180 MCG/ACT IN AEPB: INHALATION_SPRAY | RESPIRATORY_TRACT | 1 refills | Status: DC

## 2018-03-22 MED ORDER — OMEPRAZOLE 40 MG PO CPDR: 40.0000 mg | DELAYED_RELEASE_CAPSULE | Freq: Every day | ORAL | 11 refills | Status: DC

## 2018-03-22 NOTE — Patient Instructions (Signed)
We have sent the following medications to your pharmacy for you to pick up at your convenience: pulmicort 2 puffs swallowed (not inhaled) twice daily and omeprazole 40 mg one tablet by mouth every morning.   Thank you for choosing me and [facility].  Venita LickMalcolm T. Pleas KochStark, Jr., MD., Clementeen GrahamFACG

## 2018-03-22 NOTE — Progress Notes (Signed)
    History of Present Illness: This is a 48 year old male with eosinophilic esophagitis and GERD.  He initially presented in 2012 and did not return for follow-up until October 2019 as his dysphagia recurred.  Budesonide inhaler was restarted and EGD with dilation was performed.  He has continued on omeprazole 20 mg daily. His reflux is under fair control.  He notes breakthrough symptoms and intermittent mild chest pain.  He is closely following antireflux measures and has made multiple dietary changes which he states have helped significantly.  He notes very transient and brief dysphagia occasionally when eating a biscuit however no other dysphagia symptoms.  Current Medications, Allergies, Past Medical History, Past Surgical History, Family History and Social History were reviewed in [employer] record.  Physical Exam: General: Well developed, well nourished, no acute distress Head: Normocephalic and atraumatic Eyes:  sclerae anicteric, EOMI Ears: Normal auditory acuity Mouth: No deformity or lesions Lungs: Clear throughout to auscultation Heart: Regular rate and rhythm; no murmurs, rubs or bruits Abdomen: Soft, non tender and non distended. No masses, hepatosplenomegaly or hernias noted. Normal Bowel sounds Rectal: Not done Musculoskeletal: Symmetrical with no gross deformities  Pulses:  Normal pulses noted Extremities: No clubbing, cyanosis, edema or deformities noted Neurological: Alert oriented x 4, grossly nonfocal Psychological:  Alert and cooperative. Normal mood and affect   Assessment and Recommendations:  1.  Eosinophilic esophagitis.  GERD with frequent breakthrough. Dysphagia substantially improved.  Continue Pulmicort inhaler 2 puffs swallowed twice daily.  Increase omeprazole 40 mg po daily.  If his reflux symptoms are not under much better control after 1 month he is advised to call for consideration of a PPI twice daily or adding an H2 RA at bedtime.  REV in 6 months.

## 2018-05-03 ENCOUNTER — Other Ambulatory Visit: Payer: Self-pay | Admitting: Gastroenterology

## 2019-02-15 ENCOUNTER — Encounter: Payer: Self-pay | Admitting: Gastroenterology

## 2019-02-15 ENCOUNTER — Ambulatory Visit (INDEPENDENT_AMBULATORY_CARE_PROVIDER_SITE_OTHER): Payer: 59 | Admitting: Gastroenterology

## 2019-02-15 ENCOUNTER — Other Ambulatory Visit: Payer: Self-pay

## 2019-02-15 VITALS — BP 120/90 | HR 104 | Temp 98.5°F | Ht 68.0 in | Wt 213.4 lb

## 2019-02-15 DIAGNOSIS — K200 Eosinophilic esophagitis: Secondary | ICD-10-CM

## 2019-02-15 DIAGNOSIS — R1310 Dysphagia, unspecified: Secondary | ICD-10-CM | POA: Diagnosis not present

## 2019-02-15 DIAGNOSIS — R1319 Other dysphagia: Secondary | ICD-10-CM

## 2019-02-15 MED ORDER — OMEPRAZOLE 40 MG PO CPDR: 40.0000 mg | DELAYED_RELEASE_CAPSULE | Freq: Two times a day (BID) | ORAL | 11 refills | Status: DC

## 2019-02-15 MED ORDER — PULMICORT FLEXHALER 180 MCG/ACT IN AEPB: INHALATION_SPRAY | RESPIRATORY_TRACT | 1 refills | Status: DC

## 2019-02-15 NOTE — Progress Notes (Signed)
    History of Present Illness: This is a 48 year old male with a history of eosinophilic esophagitis and GERD who has had a recurrence of solid food dysphagia over the past few months.  He had an episode where it piece of chicken was stuck for about 2 hours that spontaneously resolved.  He has been compliant with omeprazole daily however he does not take his budesonide inhaler regularly.  He states recently he has restarted inhaler as scheduled.  Current Medications, Allergies, Past Medical History, Past Surgical History, Family History and Social History were reviewed in [employer] record.  Physical Exam: General: Well developed, well nourished, no acute distress Head: Normocephalic and atraumatic Eyes:  sclerae anicteric, EOMI Ears: Normal auditory acuity Mouth: No deformity or lesions Lungs: Clear throughout to auscultation Heart: Regular rate and rhythm; no murmurs, rubs or bruits Abdomen: Soft, non tender and non distended. No masses, hepatosplenomegaly or hernias noted. Normal Bowel sounds Rectal: Not done Musculoskeletal: Symmetrical with no gross deformities  Pulses:  Normal pulses noted Extremities: No clubbing, cyanosis, edema or deformities noted Neurological: Alert oriented x 4, grossly nonfocal Psychological:  Alert and cooperative. Normal mood and affect   Assessment and Recommendations:  1.  Solid food dysphagia.  Eosinophilic esophagitis.  GERD.  Resume budesonide inhaler 2 puffs swallowed twice daily.  Increase omeprazole to 40 mg twice daily.  Closely follow antireflux measures.  Offered option of endoscopy now or endoscopy if dysphagia does not improve and he prefers the latter. REV in 6 weeks.

## 2019-02-15 NOTE — Patient Instructions (Signed)
We have sent the following medications to your pharmacy for you to pick up at your convenience: budesonide inhaler and omeprazole 40 mg twice daily.   Call our office back sooner if your trouble swallowing worsens before your follow up appointment.  Thank you for choosing me and [facility].  Pricilla Riffle. Dagoberto Ligas., MD., Marval Regal

## 2019-03-20 ENCOUNTER — Other Ambulatory Visit: Payer: Self-pay

## 2019-03-20 MED ORDER — OMEPRAZOLE 40 MG PO CPDR: 40.0000 mg | DELAYED_RELEASE_CAPSULE | Freq: Two times a day (BID) | ORAL | 5 refills | Status: DC

## 2019-04-03 ENCOUNTER — Ambulatory Visit (INDEPENDENT_AMBULATORY_CARE_PROVIDER_SITE_OTHER): Payer: 59 | Admitting: Gastroenterology

## 2019-04-03 ENCOUNTER — Encounter: Payer: Self-pay | Admitting: Gastroenterology

## 2019-04-03 VITALS — BP 138/100 | HR 96 | Temp 97.3°F | Ht 67.25 in | Wt 208.1 lb

## 2019-04-03 DIAGNOSIS — R1319 Other dysphagia: Secondary | ICD-10-CM

## 2019-04-03 DIAGNOSIS — K219 Gastro-esophageal reflux disease without esophagitis: Secondary | ICD-10-CM | POA: Diagnosis not present

## 2019-04-03 DIAGNOSIS — J45909 Unspecified asthma, uncomplicated: Secondary | ICD-10-CM | POA: Diagnosis not present

## 2019-04-03 DIAGNOSIS — K200 Eosinophilic esophagitis: Secondary | ICD-10-CM

## 2019-04-03 DIAGNOSIS — R1310 Dysphagia, unspecified: Secondary | ICD-10-CM | POA: Diagnosis not present

## 2019-04-03 MED ORDER — PULMICORT FLEXHALER 180 MCG/ACT IN AEPB: INHALATION_SPRAY | RESPIRATORY_TRACT | 11 refills | Status: DC

## 2019-04-03 NOTE — Progress Notes (Signed)
    History of Present Illness: This is a 49 year old male with eosinophilic esophagitis and GERD.  He relates persistent problems with solid dysphagia however his symptoms have improved with regular medication use and attention to his diet.  Patient notes his asthma has been more bothersome recently with frequent wheezing and mild shortness of breath.  He states his current inhaler has not been effective in improving his asthma symptoms.  Current Medications, Allergies, Past Medical History, Past Surgical History, Family History and Social History were reviewed in [employer] record.   Physical Exam: General: Well developed, well nourished, no acute distress Head: Normocephalic and atraumatic Eyes:  sclerae anicteric, EOMI Ears: Normal auditory acuity Mouth: No deformity or lesions Lungs: Bilateral wheezes Heart: Regular rate and rhythm; no murmurs, rubs or bruits Abdomen: Soft, non tender and non distended. No masses, hepatosplenomegaly or hernias noted. Normal Bowel sounds Rectal: Not done Musculoskeletal: Symmetrical with no gross deformities  Pulses:  Normal pulses noted Extremities: No clubbing, cyanosis, edema or deformities noted Neurological: Alert oriented x 4, grossly nonfocal Psychological:  Alert and cooperative. Normal mood and affect   Assessment and Recommendations:  1.  Eosinophilic esophagitis, GERD and dysphagia.  Continue omeprazole 40 mg twice daily and budesonide inhaler 2 puffs twice daily swallowed.  Encouraged him to proceed with EGD given persistent dysphagia however he would like to wait to see if it continues to improve.  REV in 2 months.   2.  Asthma. Pulmonary referral.

## 2019-04-03 NOTE — Patient Instructions (Addendum)
If you are age 50 or older, your body mass index should be between 23-30. Your Body mass index is 32.36 kg/m. If this is out of the aforementioned range listed, please consider follow up with your Primary Care Provider.  If you are age 85 or younger, your body mass index should be between 19-25. Your Body mass index is 32.36 kg/m. If this is out of the aformentioned range listed, please consider follow up with your Primary Care Provider.   Refills of budesonide inhaler was sent to your pharmacy for pick up.  Thank you

## 2019-12-20 ENCOUNTER — Encounter: Payer: Self-pay | Admitting: Pulmonary Disease

## 2019-12-20 ENCOUNTER — Other Ambulatory Visit: Payer: Self-pay

## 2019-12-20 ENCOUNTER — Ambulatory Visit (INDEPENDENT_AMBULATORY_CARE_PROVIDER_SITE_OTHER): Payer: 59 | Admitting: Pulmonary Disease

## 2019-12-20 DIAGNOSIS — J3089 Other allergic rhinitis: Secondary | ICD-10-CM

## 2019-12-20 MED ORDER — ALBUTEROL SULFATE HFA 108 (90 BASE) MCG/ACT IN AERS: 2.0000 | INHALATION_SPRAY | Freq: Four times a day (QID) | RESPIRATORY_TRACT | 3 refills | Status: DC | PRN

## 2019-12-20 MED ORDER — BUDESONIDE-FORMOTEROL FUMARATE 80-4.5 MCG/ACT IN AERO: 2.0000 | INHALATION_SPRAY | Freq: Every day | RESPIRATORY_TRACT | 3 refills | Status: DC

## 2019-12-20 MED ORDER — FLUTICASONE PROPIONATE 50 MCG/ACT NA SUSP: 2.0000 | Freq: Every day | NASAL | 6 refills | Status: DC

## 2019-12-20 MED ORDER — BUDESONIDE-FORMOTEROL FUMARATE 160-4.5 MCG/ACT IN AERO: 2.0000 | INHALATION_SPRAY | Freq: Two times a day (BID) | RESPIRATORY_TRACT | 3 refills | Status: DC

## 2019-12-20 NOTE — Assessment & Plan Note (Signed)
Very important to control reflux symptoms here

## 2019-12-20 NOTE — Addendum Note (Signed)
Addended by: Benjie Karvonen R on: 12/20/2019 04:52 PM   Modules accepted: Orders

## 2019-12-20 NOTE — Assessment & Plan Note (Signed)
Flonase nasal spray 1 each nare. RAST panel

## 2019-12-20 NOTE — Progress Notes (Signed)
° °  Subjective:    Patient ID: Dustin Nichols, male    DOB: 17-Nov-1970, 49 y.o.   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 He carries a diagnosis of eosinophilic esophagitis, has had his esophagus stretched about 4 times in the last 20 years.  During his last GI evaluation he was found to be wheezing and hence referred to [place]. He reports asthma diagnosis since about 20 years, triggers being weather changes.  For most of his life symptoms were mild intermittent and controlled with rescue inhaler alone.  For the last 6 months, he reports increased dyspnea and wheezing.  He reports nocturnal symptoms.  He has been using over-the-counter Primatene inhaler with limited benefit. He uses Zyrtec daily for nasal congestion and sinus drip. He has intermittent dysphagia, last esophageal dilatation was October 2019. He reports intermittent reflux symptoms in spite of taking omeprazole.  He has not established with a new PCP in the last 2 years  PMH -psoriasis, eosinophilic esophagitis with dysphagia   Significant tests/ events reviewed  Chest x-ray 12/2017 at [facility] showed clear lung fields. 12/2017 WBC count was 10.9 with 3.2% eosinophils    PMH -eosinophilic esophagitis and GERD   Past Medical History:  Diagnosis Date   Asthma    Depression    Eosinophilic esophagitis    GERD (gastroesophageal reflux disease)    Hiatal hernia    Hypertension    Peptic stricture of esophagus      Review of Systems Shortness of breath with activity Cough productive of clear phlegm Nasal congestion Sneezing Dysphagia especially to solids    Objective:   Physical Exam  Gen. Pleasant, well-nourished, in no distress, normal affect ENT - no pallor,icterus, no post nasal  drip Neck: No JVD, no thyromegaly, no carotid bruits Lungs: no use of accessory muscles, no dullness to percussion, BL scattered rhonchi  Cardiovascular: Rhythm regular, heart sounds  normal, no murmurs or gallops, no peripheral edema Abdomen: soft and non-tender, no hepatosplenomegaly, BS normal. Musculoskeletal: No deformities, no cyanosis or clubbing Neuro:  alert, non focal       Assessment & Plan:

## 2019-12-20 NOTE — Assessment & Plan Note (Signed)
Prescription for Symbicort 80 -2 puffs twice daily. -This would be your maintenance medication  Prescription for albuterol MDI 2 puffs every 6 hours as needed -this would be a rescue medication  Triggers could be sinus drip or reflux .  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  He does not seem to have nasal polyps.  He does have 2 dogs at home.  We will define his phenotype.  Once symptoms controlled we will obtain spirometry pre and post  Blood work today -CBC with differential, bmet, RAST panel

## 2019-12-20 NOTE — Patient Instructions (Addendum)
  Prescription for Symbicort 80 -2 puffs twice daily. -This would be your maintenance medication  Prescription for albuterol MDI 2 puffs every 6 hours as needed -this would be a rescue medication  Triggers could be sinus drip or reflux  Blood work today -CBC with differential, bmet, RAST panel

## 2019-12-21 LAB — RESPIRATORY ALLERGY PROFILE REGION II ~~LOC~~
Allergen, A. alternata, m6: 0.57 kU/L — ABNORMAL HIGH
Allergen, Cedar tree, t12: 1.97 kU/L — ABNORMAL HIGH
Allergen, Comm Silver Birch, t9: 1.3 kU/L — ABNORMAL HIGH
Allergen, Cottonwood, t14: 2.33 kU/L — ABNORMAL HIGH
Allergen, D pternoyssinus,d7: 32.8 kU/L — ABNORMAL HIGH
Allergen, Mouse Urine Protein, e78: 1.51 kU/L — ABNORMAL HIGH
Allergen, Mulberry, t76: 1.29 kU/L — ABNORMAL HIGH
Allergen, Oak,t7: 3.8 kU/L — ABNORMAL HIGH
Allergen, P. notatum, m1: 0.39 kU/L — ABNORMAL HIGH
Aspergillus fumigatus, m3: 0.15 kU/L — ABNORMAL HIGH
Bermuda Grass: 8.22 kU/L — ABNORMAL HIGH
Box Elder IgE: 3.87 kU/L — ABNORMAL HIGH
CLADOSPORIUM HERBARUM (M2) IGE: 0.15 kU/L — ABNORMAL HIGH
COMMON RAGWEED (SHORT) (W1) IGE: 14.3 kU/L — ABNORMAL HIGH
Cat Dander: 31.5 kU/L — ABNORMAL HIGH
Class: 1
Class: 1
Class: 2
Class: 2
Class: 2
Class: 2
Class: 2
Class: 2
Class: 2
Class: 3
Class: 3
Class: 3
Class: 3
Class: 3
Class: 3
Class: 3
Class: 3
Class: 4
Class: 4
Class: 4
Class: 5
Class: 5
Cockroach: 1.07 kU/L — ABNORMAL HIGH
D. farinae: 24.9 kU/L — ABNORMAL HIGH
Dog Dander: 70.8 kU/L — ABNORMAL HIGH
Elm IgE: 4.37 kU/L — ABNORMAL HIGH
IgE (Immunoglobulin E), Serum: 1675 kU/L — ABNORMAL HIGH (ref <=114)
Johnson Grass: 6.76 kU/L — ABNORMAL HIGH
Pecan/Hickory Tree IgE: 3.81 kU/L — ABNORMAL HIGH
Rough Pigweed  IgE: 2.5 kU/L — ABNORMAL HIGH
Sheep Sorrel IgE: 3.76 kU/L — ABNORMAL HIGH
Timothy Grass: 51.7 kU/L — ABNORMAL HIGH

## 2019-12-21 LAB — BASIC METABOLIC PANEL
BUN: 15 mg/dL (ref 6–23)
CO2: 29 mEq/L (ref 19–32)
Calcium: 9.9 mg/dL (ref 8.4–10.5)
Chloride: 102 mEq/L (ref 96–112)
Creatinine, Ser: 0.94 mg/dL (ref 0.40–1.50)
GFR: 93.91 mL/min (ref >60.00)
Glucose, Bld: 96 mg/dL (ref 70–99)
Potassium: 4.3 mEq/L (ref 3.5–5.1)
Sodium: 139 mEq/L (ref 135–145)

## 2019-12-21 LAB — CBC WITH DIFFERENTIAL/PLATELET
Basophils Absolute: 0.1 10*3/uL (ref 0.0–0.1)
Basophils Relative: 1.1 % (ref 0.0–3.0)
Eosinophils Absolute: 1 10*3/uL — ABNORMAL HIGH (ref 0.0–0.7)
Eosinophils Relative: 10.2 % — ABNORMAL HIGH (ref 0.0–5.0)
HCT: 46.5 % (ref 39.0–52.0)
Hemoglobin: 15.9 g/dL (ref 13.0–17.0)
Lymphocytes Relative: 24.7 % (ref 12.0–46.0)
Lymphs Abs: 2.3 10*3/uL (ref 0.7–4.0)
MCHC: 34.1 g/dL (ref 30.0–36.0)
MCV: 86.8 fl (ref 78.0–100.0)
Monocytes Absolute: 0.8 10*3/uL (ref 0.1–1.0)
Monocytes Relative: 8.7 % (ref 3.0–12.0)
Neutro Abs: 5.3 10*3/uL (ref 1.4–7.7)
Neutrophils Relative %: 55.3 % (ref 43.0–77.0)
Platelets: 262 10*3/uL (ref 150.0–400.0)
RBC: 5.36 Mil/uL (ref 4.22–5.81)
RDW: 13.3 % (ref 11.5–15.5)
WBC: 9.5 10*3/uL (ref 4.0–10.5)

## 2019-12-21 LAB — INTERPRETATION:

## 2019-12-22 ENCOUNTER — Telehealth: Payer: Self-pay | Admitting: Pulmonary Disease

## 2019-12-22 ENCOUNTER — Other Ambulatory Visit: Payer: Self-pay

## 2019-12-22 ENCOUNTER — Telehealth: Payer: Self-pay

## 2019-12-22 DIAGNOSIS — J3089 Other allergic rhinitis: Secondary | ICD-10-CM

## 2019-12-22 NOTE — Telephone Encounter (Signed)
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

## 2019-12-22 NOTE — Telephone Encounter (Signed)
Called and spoke with pt who stated that PA was needing to be performed for the symbicort inhaler.  Medication name and strength: symbicort 80 Provider: alva [facility]  Patient insurance ID: [id number] Phone: [phone] Fax: [phone]  Was the PA started on CMM?  yes If yes, please enter the Key: [id number] Timeframe for approval/denial: check back in 5 business days for results; may also need to call the plan at [phone]  Will keep encounter in triage while we await PA determination

## 2019-12-22 NOTE — Progress Notes (Signed)
Called and left message on voicemail to please return call. Contact number provided.

## 2019-12-22 NOTE — Telephone Encounter (Signed)
Patient returned phone call and went over lab results per Dr Vassie Loll. Patient expressed understanding and agreeable to referral to allergy clinic. Referral order placed per Dr Vassie Loll. Nothing further needed at this time.

## 2019-12-22 NOTE — Progress Notes (Signed)
Patient returned call and went over lab results per Dr Vassie Loll. Patient expressed understanding and agreeable to referral to allergy clinic. Referral order placed per Dr Vassie Loll. Nothing further needed at this time.

## 2019-12-25 NOTE — Telephone Encounter (Signed)
Checked Cover My Meds. PA is still pending. 

## 2019-12-26 NOTE — Telephone Encounter (Signed)
Called [employer] at [phone] and spoke with Tori to obtain status update on PA that was initiated via cover my meds. Delorise Jackson stated that they are not affiliated with cover my meds so we would need to go to [email or web address] to submit PA.  Prior authorization has been submitted via [employer]. Prior Auth (EOC) ID: [id number]  Message shown states that the prior authorization department will contact you if additional information is needed. Once decision is made, we will be notified of the decision. We can also check status of the request by using the check status link and also need to note down the [employer] EOC ID to use to check status.

## 2020-01-04 NOTE — Telephone Encounter (Signed)
I checked myself and it is showing that there is no data available.  Called [employer] about this and they said that for some reason on their end, it was showing the same thing and they did not know why. Was told to reinitiate the PA via [place] [facility] again and to also fax chart notes to them so that they had it to be able to review.  Will reinitiate the PA via [employer].

## 2020-01-04 NOTE — Telephone Encounter (Signed)
Irving Burton, please advise on the status. When I check the status it states 'no data available'. Thanks.

## 2020-01-05 NOTE — Telephone Encounter (Signed)
Since pt has only tried Pulmicort Flexhaler other than the Symbicort 80 which pt is currently on, called [employer] to see which inhalers were preferred with pt's insurance.  Spoke with Deanna Artis who stated that on 10/15, it was approved for pt to receive the generic of Symbicort 80. Pt's copay was $30. Pt picked up the inhaler. Nothing further needed.

## 2020-01-31 ENCOUNTER — Other Ambulatory Visit: Payer: Self-pay

## 2020-01-31 ENCOUNTER — Ambulatory Visit (INDEPENDENT_AMBULATORY_CARE_PROVIDER_SITE_OTHER): Payer: 59 | Admitting: Allergy and Immunology

## 2020-01-31 ENCOUNTER — Encounter: Payer: Self-pay | Admitting: Allergy and Immunology

## 2020-01-31 VITALS — BP 136/80 | HR 86 | Temp 98.3°F | Resp 16 | Ht 66.73 in | Wt 204.6 lb

## 2020-01-31 DIAGNOSIS — K200 Eosinophilic esophagitis: Secondary | ICD-10-CM

## 2020-01-31 DIAGNOSIS — L2089 Other atopic dermatitis: Secondary | ICD-10-CM | POA: Diagnosis not present

## 2020-01-31 DIAGNOSIS — J3089 Other allergic rhinitis: Secondary | ICD-10-CM | POA: Diagnosis not present

## 2020-01-31 DIAGNOSIS — J301 Allergic rhinitis due to pollen: Secondary | ICD-10-CM

## 2020-01-31 DIAGNOSIS — J4540 Moderate persistent asthma, uncomplicated: Secondary | ICD-10-CM | POA: Diagnosis not present

## 2020-01-31 DIAGNOSIS — L989 Disorder of the skin and subcutaneous tissue, unspecified: Secondary | ICD-10-CM

## 2020-01-31 DIAGNOSIS — D7219 Other eosinophilia: Secondary | ICD-10-CM

## 2020-01-31 MED ORDER — MOMETASONE FUROATE 0.1 % EX CREA: TOPICAL_CREAM | CUTANEOUS | 5 refills | Status: DC

## 2020-01-31 MED ORDER — SYMBICORT 160-4.5 MCG/ACT IN AERO: INHALATION_SPRAY | RESPIRATORY_TRACT | 5 refills | Status: DC

## 2020-01-31 MED ORDER — BUDESONIDE 0.5 MG/2ML IN SUSP: RESPIRATORY_TRACT | 5 refills | Status: DC

## 2020-01-31 NOTE — Progress Notes (Signed)
[place] - [place] - [place] - [place]   Dear Dr. Vassie Loll,  Thank you for referring Dustin Nichols to the [facility] [place] on 01/31/2020.   Below is a summation of this patient's evaluation and recommendations.  Thank you for your referral. I will keep you informed about this patient's response to treatment.   If you have any questions please do not hesitate to contact me.   Sincerely,  Jessica Priest, MD Allergy / Immunology [place] [facility]   ______________________________________________________________________    NEW PATIENT NOTE  Referring Provider: Oretha Milch, MD Primary Provider: Patient, No Pcp Per Date of office visit: 01/31/2020    Subjective:   Chief Complaint:  Dustin Nichols (DOB: 1971/01/08) is a 49 y.o. male who presents to the clinic on 01/31/2020 with a chief complaint of Allergic Rhinitis  and Asthma .     HPI: Dustin Nichols presents to this clinic in evaluation of respiratory tract issues.  Dustin Nichols has a long history of nasal congestion and sneezing and itchy nose that appears to occur on a perennial basis and also has occasional issues with intermittent wheezing and coughing treated with a short acting bronchodilator.  Apparently when Dustin Nichols was much younger Dustin Nichols had a little bit more active asthma but it appeared to improve and maybe even resolve but all of his respiratory tract symptoms have increased dramatically since 2021.  Since the beginning of 2021 Dustin Nichols has had wheezing and coughing and Dustin Nichols was using an over-the-counter short acting bronchodilator multiple times per day for which she sought out care with Dr. Vassie Loll, pulmonology, and was treated with Symbicort.  Symbicort has resulted in rather significant improvement regarding all of his lower respiratory tract symptoms and Dustin Nichols has no need to use a short acting bronchodilator at this point in time and Dustin Nichols can exert himself  without any difficulty.  With cold air exposure that we have had to date Dustin Nichols has not really had any significant problems.  As well, his upper airway symptoms increased significantly since 2021 and Dustin Nichols was started on Flonase which has helped this issue but Dustin Nichols still has nasal congestion and sneezing and Dustin Nichols still has some intermittent inability to smell that well.  Dustin Nichols does not have any ugly nasal discharge or headaches or complete anosmia.   There is not really any obvious provoking factor that is giving rise to these respiratory tract symptoms in the past or presently.  In addition, Dustin Nichols has had eczema for many years mostly involving his legs but sometimes his elbows which appears to be improving as Dustin Nichols ages.  Dustin Nichols was given a topical agent that started with a "C" but Dustin Nichols rarely utilizes this agent at this point in time.  Dustin Nichols also has a history of eosinophilic esophagitis and has required four esophageal dilations with his last dilation occurring in 2019.  Dustin Nichols is currently using omeprazole twice a day.  Dustin Nichols still has problems with gurgling in his chest and regurgitation and still has intermittent obstruction that Dustin Nichols can clear with gagging and regurgitating.  Dustin Nichols consumes 1-3 Mountain Dew's per day and has 1-3 beers per day and does not really consume any chocolate.  Dustin Nichols is not that interested in receiving either a Covid vaccine or a flu vaccine.  Past Medical History:  Diagnosis Date  . Asthma   . Depression   . Eosinophilic esophagitis   . GERD (gastroesophageal reflux disease)   .  Hiatal hernia   . Hypertension   . Peptic stricture of esophagus     Past Surgical History:  Procedure Laterality Date  . SURGERY SCROTAL / TESTICULAR     blockage  . UPPER GASTROINTESTINAL ENDOSCOPY      Allergies as of 01/31/2020      Reactions   Amoxicillin    REACTION: Rash      Medication List      albuterol 108 (90 Base) MCG/ACT inhaler Commonly known as: VENTOLIN HFA Inhale 2 puffs into the lungs  every 6 (six) hours as needed for wheezing or shortness of breath.   budesonide-formoterol 80-4.5 MCG/ACT inhaler Commonly known as: Symbicort Inhale 2 puffs into the lungs daily.   cetirizine 10 MG tablet Commonly known as: ZYRTEC Take 10 mg by mouth daily.   fluticasone 50 MCG/ACT nasal spray Commonly known as: FLONASE Place 2 sprays into both nostrils daily.   omeprazole 40 MG capsule Commonly known as: PRILOSEC Take 1 capsule (40 mg total) by mouth 2 (two) times daily.       Review of systems negative except as noted in HPI / PMHx or noted below:  Review of Systems  Constitutional: Negative.   HENT: Negative.   Eyes: Negative.   Respiratory: Negative.   Cardiovascular: Negative.   Gastrointestinal: Negative.   Genitourinary: Negative.   Musculoskeletal: Negative.   Skin: Negative.   Neurological: Negative.   Endo/Heme/Allergies: Negative.   Psychiatric/Behavioral: Negative.     Family History  Problem Relation Age of Onset  . Diabetes Father   . Hypertension Mother   . Diabetes Paternal Grandmother   . Heart attack Paternal Grandmother   . Heart attack Maternal Grandfather   . Diabetes Maternal Grandmother   . Colon cancer Neg Hx   . Esophageal cancer Neg Hx   . Rectal cancer Neg Hx   . Allergic rhinitis Neg Hx   . Angioedema Neg Hx   . Asthma Neg Hx   . Eczema Neg Hx   . Immunodeficiency Neg Hx   . Urticaria Neg Hx     Social History   Socioeconomic History  . Marital status: Married    Spouse name: Clydie Braun  . Number of children: 1  . Years of education: Not on file  . Highest education level: Not on file  Occupational History  . Occupation: Nurse, children's    Comment: [employer]  Tobacco Use  . Smoking status: Never Smoker  . Smokeless tobacco: Never Used  Vaping Use  . Vaping Use: Never used  Substance and Sexual Activity  . Alcohol use: No  . Drug use: No  . Sexual activity: Yes    Partners: Female  Other Topics Concern  .  Not on file  Social History Narrative  . Not on file    Environmental and Social history  Lives in a house with a dry environment, dogs located inside the household, carpet in the bedroom, plastic on the bed, no plastic on the pillow, no smoking ongoing with inside the household.  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  Objective:   Vitals:   01/31/20 1423  BP: 136/80  Pulse: 86  Resp: 16  Temp: 98.3 F (36.8 C)  SpO2: 95%   Height: 5' 6.73" (169.5 cm) Weight: 204 lb 9.4 oz (92.8 kg)  Physical Exam Constitutional:      Appearance: Dustin Nichols is not diaphoretic.  HENT:     Head: Normocephalic.     Right Ear: Tympanic  membrane, ear canal and external ear normal.     Left Ear: Tympanic membrane, ear canal and external ear normal.     Nose: Septal deviation (Right to left) and mucosal edema present. No nasal deformity or rhinorrhea.     Mouth/Throat:     Pharynx: Uvula midline. No oropharyngeal exudate.  Eyes:     Conjunctiva/sclera: Conjunctivae normal.  Neck:     Thyroid: No thyromegaly.     Trachea: Trachea normal. No tracheal tenderness or tracheal deviation.  Cardiovascular:     Rate and Rhythm: Normal rate and regular rhythm.     Heart sounds: Normal heart sounds, S1 normal and S2 normal. No murmur heard.   Pulmonary:     Effort: No respiratory distress.     Breath sounds: Normal breath sounds. No stridor. No wheezing (Scattered expiratory wheezes heard posterior lung fields) or rales.  Lymphadenopathy:     Head:     Right side of head: No tonsillar adenopathy.     Left side of head: No tonsillar adenopathy.     Cervical: No cervical adenopathy.  Skin:    Findings: No erythema or rash (1 cm diameter verrucous-like lesion with hyperpigmentation left posterior mid back).     Nails: There is no clubbing.  Neurological:     Mental Status: Dustin Nichols is alert.     Diagnostics: Allergy skin tests were performed.  Dustin Nichols demonstrated hypersensitivity to house dust mite, cat,  dog, horse, trees, grasses, weeds.  Dustin Nichols also had very slight hypersensitivity against cashew but can eat this food with no problem.  Spirometry was performed and demonstrated an FEV1 of 4.12 @ 116 % of predicted. FEV1/FVC = 0.90  Results of blood tests obtained 20 December 2019 identifies WBC 9.5, absolute eosinophil 1000, absolute lymphocyte 2300, hemoglobin 15.9, platelet 262, IgE 1675 KU/L, multiple aeroallergens with high titer IgE antibodies including dust mite, cat, dog, various pollens and low level of IgE antibodies directed against mold.  Results of a chest x-ray obtained 14 December 2017 identifies no significant abnormality.  Assessment and Plan:    1. Asthma, moderate persistent, well-controlled   2. Perennial allergic rhinitis   3. Seasonal allergic rhinitis due to pollen   4. Other atopic dermatitis   5. Eosinophilic esophagitis   6. Other eosinophilia   7. Skin lesion of back     1.  Allergen avoidance measures - Dust mite, cat, dog, pollens  2.  Treat and prevent inflammation:   A. Symbicort 160 - 2 inhalations 2 times per day  B. flonase - 1 spray each nostril 2 times per day  C. Budesonide 0.5 / 10 Splenda slurry 2 times per day  3.  Consolidate caffeine consumption as much as possible  4.  If needed:   A. Albuterol HFA - 2 inhalations every 4-6 hours  B. OTC antihistamine - Claritin / Zrytec 10 mg - 1 tablet 1 time per day  C. Mometasone 0.1% cream applied to eczema 1 time per day  5.  Recommend COVID vaccine and flu vaccine  6.  Consider a course of immunotherapy  7.  Return to clinic in 8 weeks or earlier if problem  8.  Visit with dermatology to evaluate back skin abnormality  Dustin Nichols has significant immune dysregulation with eosinophilic disease of multiple organs including his upper and lower respiratory tract, esophagus, and skin.  We will treat him with a collection of anti-inflammatory agents as noted above and have him undergo allergen avoidance  measures as best  as possible.  I have given him literature about immunotherapy as Dustin Nichols would be a candidate for this form of treatment given his multiorgan atopic disease and his requirement for medications.  As well, I have encouraged him to consolidate his caffeine consumption given the fact that his esophagus probably does not work very well and caffeine will precipitate some of his reflux events.  Dustin Nichols has a skin lesion on his back and we will have him visit with dermatology to further evaluate this issue.  I will see him back in his clinic in 8 weeks or earlier if there is a problem.  Jessica PriestEric J. Damaria Vachon, MD Allergy / Immunology [place] [facility] [place]

## 2020-01-31 NOTE — Patient Instructions (Addendum)
°  1.  Allergen avoidance measures - Dust mite, cat, dog, pollens  2.  Treat and prevent inflammation:   A. Symbicort 160 - 2 inhalations 2 times per day  B. flonase - 1 spray each nostril 2 times per day  C. Budesonide 0.5 / 10 Splenda slurry 2 times per day  3.  Consolidate caffeine consumption as much as possible  4.  If needed:   A. Albuterol HFA - 2 inhalations every 4-6 hours  B. OTC antihistamine - Claritin / Zrytec 10 mg - 1 tablet 1 time per day  C. Mometasone 0.1% cream applied to eczema 1 time per day  5.  Recommend COVID vaccine and flu vaccine  6.  Consider a course of immunotherapy  7.  Return to clinic in 8 weeks or earlier if problem  8.  Visit with dermatology to evaluate back skin abnormality

## 2020-02-01 ENCOUNTER — Telehealth: Payer: Self-pay

## 2020-02-01 ENCOUNTER — Encounter: Payer: Self-pay | Admitting: Allergy and Immunology

## 2020-02-01 NOTE — Telephone Encounter (Signed)
Please inform patient that Dr. Lucie Leather wants to make the referral and make referral.

## 2020-02-01 NOTE — Telephone Encounter (Signed)
-----   Message from Jessica Priest, MD sent at 02/01/2020  7:23 AM EST ----- Please inform Dustin Nichols that we need to refer him to [facility] dermatology to evaluate the lesion on his back.

## 2020-02-12 ENCOUNTER — Other Ambulatory Visit (HOSPITAL_COMMUNITY): Payer: 59

## 2020-02-15 ENCOUNTER — Ambulatory Visit: Payer: 59 | Admitting: Pulmonary Disease

## 2020-02-23 NOTE — Telephone Encounter (Signed)
[facility].  They have reached out to patient but have not heard back from him as of yet.  I asked them to let us know when patient has scheduled appointment and we can send up notes if needed.

## 2020-03-04 ENCOUNTER — Telehealth: Payer: Self-pay | Admitting: Allergy and Immunology

## 2020-03-04 NOTE — Telephone Encounter (Signed)
Patient said he needs a prior authorization for his budesonide. He said the prescription is for twice in the morning and twice at night.

## 2020-03-04 NOTE — Telephone Encounter (Signed)
Talked with patient.  He needs a prior authorization on the Symbicort 160.  Will start prior authorization.

## 2020-03-05 MED ORDER — SYMBICORT 160-4.5 MCG/ACT IN AERO
INHALATION_SPRAY | RESPIRATORY_TRACT | 5 refills | Status: DC
Start: 2020-03-05 — End: 2020-03-07

## 2020-03-05 NOTE — Telephone Encounter (Signed)
No prior authorization needed per [employer] ( prescription insurance).  Will resend ERX to pharmacy.

## 2020-03-05 NOTE — Telephone Encounter (Signed)
Patient scheduled to see Ebony Cargo on 03-06-20 at 3:05 pm per fax from [facility].

## 2020-03-05 NOTE — Addendum Note (Signed)
Addended by: Alphonzo Cruise on: 03/05/2020 02:52 PM   Modules accepted: Orders

## 2020-03-07 MED ORDER — BUDESONIDE-FORMOTEROL FUMARATE 160-4.5 MCG/ACT IN AERO: INHALATION_SPRAY | RESPIRATORY_TRACT | 5 refills | Status: DC

## 2020-03-07 NOTE — Telephone Encounter (Signed)
Did end up receiving denial from [employer] for Symbicort, saying generic must be used first.  Will send in generic Symbicort.

## 2020-03-07 NOTE — Addendum Note (Signed)
Addended by: Alphonzo Cruise on: 03/07/2020 02:14 PM   Modules accepted: Orders

## 2020-03-14 MED ORDER — BUDESONIDE-FORMOTEROL FUMARATE 160-4.5 MCG/ACT IN AERO
INHALATION_SPRAY | RESPIRATORY_TRACT | 5 refills | Status: DC
Start: 2020-03-14 — End: 2020-03-19

## 2020-03-14 NOTE — Addendum Note (Signed)
Addended by: Alphonzo Cruise on: 03/14/2020 05:00 PM   Modules accepted: Orders

## 2020-03-14 NOTE — Telephone Encounter (Signed)
Patient called asking about the Symbicort inhaler.  [facility] and [facility] looks like [facility] were still trying to fill the Brand Symbicort instead of the generic.  I told them I would go ahead and resend the RX for the generic so they can try to fill that.  I called patient back and let him know about confusion at pharmacy and that I had resent the generic Symbicort.  Asked him to let us know if [facility] still not [facility] to [facility].

## 2020-03-19 MED ORDER — BUDESONIDE-FORMOTEROL FUMARATE 160-4.5 MCG/ACT IN AERO
INHALATION_SPRAY | RESPIRATORY_TRACT | 5 refills | Status: DC
Start: 2020-03-19 — End: 2021-03-27

## 2020-03-19 NOTE — Telephone Encounter (Signed)
[facility] not [facility] and would like [place] to send the prescription to [facility] in [place].  Will send ERX as requested.

## 2020-03-19 NOTE — Addendum Note (Signed)
Addended by: Alphonzo Cruise on: 03/19/2020 01:54 PM   Modules accepted: Orders

## 2020-03-25 ENCOUNTER — Other Ambulatory Visit (HOSPITAL_COMMUNITY): Payer: 59

## 2020-03-27 ENCOUNTER — Ambulatory Visit (INDEPENDENT_AMBULATORY_CARE_PROVIDER_SITE_OTHER): Payer: 59 | Admitting: Allergy and Immunology

## 2020-03-27 ENCOUNTER — Encounter: Payer: Self-pay | Admitting: Allergy and Immunology

## 2020-03-27 ENCOUNTER — Other Ambulatory Visit: Payer: Self-pay

## 2020-03-27 VITALS — BP 142/102 | HR 74 | Resp 16

## 2020-03-27 DIAGNOSIS — J301 Allergic rhinitis due to pollen: Secondary | ICD-10-CM

## 2020-03-27 DIAGNOSIS — J4540 Moderate persistent asthma, uncomplicated: Secondary | ICD-10-CM

## 2020-03-27 DIAGNOSIS — D7219 Other eosinophilia: Secondary | ICD-10-CM

## 2020-03-27 DIAGNOSIS — K200 Eosinophilic esophagitis: Secondary | ICD-10-CM

## 2020-03-27 DIAGNOSIS — J3089 Other allergic rhinitis: Secondary | ICD-10-CM | POA: Diagnosis not present

## 2020-03-27 DIAGNOSIS — L2089 Other atopic dermatitis: Secondary | ICD-10-CM | POA: Diagnosis not present

## 2020-03-27 NOTE — Progress Notes (Signed)
[place] - [place] - [place] - [name] - [place]   Follow-up Note  Referring Provider: No ref. provider found Primary Provider: Patient, No Pcp Per Date of Office Visit: 03/27/2020  Subjective:   Dustin Nichols (DOB: 06/19/1970) is a 50 y.o. male who [facility]2020 in re-evaluation of the following:  HPI: Dustin Nichols presents to this clinic in evaluation of multiorgan eosinophilic driven inflammatory disease involving his airways and skin and esophagus.  His last evaluation in this clinic was his initial evaluation of 31 January 2020.  He is doing much better regarding each issue.  He has very little issues with his respiratory tract at this point in time and feels as though his chest and his head are doing well and does not use a short acting bronchodilator and can exercise without any difficulty.  He has been consistently using a combination of Symbicort and Flonase.  There is a HEPA filter in the bedroom and there are coverings on the bed and the dog is now in a pen and away from the bed.  His skin is doing very well at this point in time.  He has been using a topical mometasone about 1 time per week.  He did visit with [facility] dermatology and had removal of the verrucous lesion on his back.  His reflux is doing well as is his swallowing issue.  He still continues to consume a few [employer] per day.  He has been using his budesonide slurry twice a day.  He will not be receiving the flu vaccine or the COVID vaccines.  Allergies as of 03/27/2020      Reactions   Amoxicillin    REACTION: Rash      Medication List      albuterol 108 (90 Base) MCG/ACT inhaler Commonly known as: VENTOLIN HFA Inhale 2 puffs into the lungs every 6 (six) hours as needed for wheezing or shortness of breath.   budesonide 0.5 MG/2ML nebulizer solution Commonly known as: PULMICORT Mix the contents of one ampule with ten packets of Splenda and take by  mouth twice daily as directed.   budesonide-formoterol 160-4.5 MCG/ACT inhaler Commonly known as: Symbicort Inhale two puffs twice daily to prevent cough or wheeze.  Rinse, gargle, and spit after use.   cetirizine 10 MG tablet Commonly known as: ZYRTEC Take 10 mg by mouth daily.   fluticasone 50 MCG/ACT nasal spray Commonly known as: FLONASE Place 2 sprays into both nostrils daily.   mometasone 0.1 % cream Commonly known as: ELOCON Can apply to eczema once daily if needed.   omeprazole 40 MG capsule Commonly known as: PRILOSEC Take 1 capsule (40 mg total) by mouth 2 (two) times daily.       Past Medical History:  Diagnosis Date  . Asthma   . Depression   . Eosinophilic esophagitis   . GERD (gastroesophageal reflux disease)   . Hiatal hernia   . Hypertension   . Peptic stricture of esophagus     Past Surgical History:  Procedure Laterality Date  . SURGERY SCROTAL / TESTICULAR     blockage  . UPPER GASTROINTESTINAL ENDOSCOPY      Review of systems negative except as noted in HPI / PMHx or noted below:  Review of Systems  Constitutional: Negative.   HENT: Negative.   Eyes: Negative.   Respiratory: Negative.   Cardiovascular: Negative.   Gastrointestinal: Negative.   Genitourinary: Negative.   Musculoskeletal: Negative.  Skin: Negative.   Neurological: Negative.   Endo/Heme/Allergies: Negative.   Psychiatric/Behavioral: Negative.      Objective:   Vitals:   03/27/20 1530  BP: (!) 142/102  Pulse: 74  Resp: 16  SpO2: 96%          Physical Exam Constitutional:      Appearance: He is not diaphoretic.  HENT:     Head: Normocephalic.     Right Ear: Tympanic membrane, ear canal and external ear normal.     Left Ear: Tympanic membrane, ear canal and external ear normal.     Nose: Nose normal. No mucosal edema or rhinorrhea.     Mouth/Throat:     Mouth: Oropharynx is clear and moist and mucous membranes are normal.     Pharynx: Uvula midline. No  oropharyngeal exudate.  Eyes:     Conjunctiva/sclera: Conjunctivae normal.  Neck:     Thyroid: No thyromegaly.     Trachea: Trachea normal. No tracheal tenderness or tracheal deviation.  Cardiovascular:     Rate and Rhythm: Normal rate and regular rhythm.     Heart sounds: Normal heart sounds, S1 normal and S2 normal. No murmur heard.   Pulmonary:     Effort: No respiratory distress.     Breath sounds: Normal breath sounds. No stridor. No wheezing or rales.  Musculoskeletal:        General: No edema.  Lymphadenopathy:     Head:     Right side of head: No tonsillar adenopathy.     Left side of head: No tonsillar adenopathy.     Cervical: No cervical adenopathy.  Skin:    Findings: No erythema or rash.     Nails: There is no clubbing.  Neurological:     Mental Status: He is alert.   3.49  Diagnostics:    Spirometry was performed and demonstrated an FEV1 of 3.49 at 98 % of predicted.   Assessment and Plan:   1. Asthma, moderate persistent, well-controlled   2. Perennial allergic rhinitis   3. Seasonal allergic rhinitis due to pollen   4. Other atopic dermatitis   5. Eosinophilic esophagitis   6. Other eosinophilia     1.  Allergen avoidance measures - Dust mite, cat, dog, pollens  2.  Treat and prevent inflammation:   A. Symbicort 160 - 2 inhalations 2 times per day  B. flonase - 1 spray each nostril 2 times per day  C. Decrease budesonide 0.5 / Splenda slurry 1 time per day  3.  Consolidate caffeine consumption as much as possible  4.  If needed:   A. Albuterol HFA - 2 inhalations every 4-6 hours  B. OTC antihistamine - Claritin / Zrytec 10 mg - 1 tablet 1 time per day  C. Mometasone 0.1% cream applied to eczema 1 time per day  5.  Consider a course of immunotherapy  6.  Return to clinic in 6 months or earlier if problem  Dustin Nichols appears to be doing very well on his current plan which includes anti-inflammatory agents for multiple organs.  I think there is  an opportunity to consolidate some of his swallowed budesonide to just 1 time per day at this point.  He needs to see the effectiveness of this plan as he goes through each season of the year.  If his plan falls apart I would encourage him once again to undergo a course of immunotherapy and we may need to consider giving him a biologic agent to address  his multiorgan eosinophilic disease.  If he does well I will see him back in his clinic in 6 months or earlier if there is a problem.  Laurette Schimke, MD Allergy / Immunology [facility]

## 2020-03-27 NOTE — Patient Instructions (Addendum)
  1.  Allergen avoidance measures - Dust mite, cat, dog, pollens  2.  Treat and prevent inflammation:   A. Symbicort 160 - 2 inhalations 2 times per day  B. flonase - 1 spray each nostril 2 times per day  C. Decrease budesonide 0.5 / Splenda slurry 1 time per day  3.  Consolidate caffeine consumption as much as possible  4.  If needed:   A. Albuterol HFA - 2 inhalations every 4-6 hours  B. OTC antihistamine - Claritin / Zrytec 10 mg - 1 tablet 1 time per day  C. Mometasone 0.1% cream applied to eczema 1 time per day  5.  Consider a course of immunotherapy  6.  Return to clinic in 6 months or earlier if problem

## 2020-03-28 ENCOUNTER — Ambulatory Visit: Payer: 59 | Admitting: Pulmonary Disease

## 2020-03-28 ENCOUNTER — Encounter: Payer: Self-pay | Admitting: Allergy and Immunology

## 2020-07-08 ENCOUNTER — Other Ambulatory Visit: Payer: Self-pay | Admitting: Pulmonary Disease

## 2020-07-08 DIAGNOSIS — J3089 Other allergic rhinitis: Secondary | ICD-10-CM

## 2020-09-22 ENCOUNTER — Other Ambulatory Visit: Payer: Self-pay | Admitting: Allergy and Immunology

## 2020-09-23 ENCOUNTER — Other Ambulatory Visit: Payer: Self-pay | Admitting: Allergy and Immunology

## 2020-09-25 ENCOUNTER — Ambulatory Visit (INDEPENDENT_AMBULATORY_CARE_PROVIDER_SITE_OTHER): Payer: 59 | Admitting: Allergy and Immunology

## 2020-09-25 ENCOUNTER — Other Ambulatory Visit: Payer: Self-pay

## 2020-09-25 ENCOUNTER — Encounter: Payer: Self-pay | Admitting: Allergy and Immunology

## 2020-09-25 ENCOUNTER — Telehealth: Payer: Self-pay

## 2020-09-25 VITALS — BP 124/86 | HR 84 | Resp 16 | Ht 66.73 in | Wt 214.4 lb

## 2020-09-25 DIAGNOSIS — K200 Eosinophilic esophagitis: Secondary | ICD-10-CM

## 2020-09-25 DIAGNOSIS — J301 Allergic rhinitis due to pollen: Secondary | ICD-10-CM | POA: Diagnosis not present

## 2020-09-25 DIAGNOSIS — J3089 Other allergic rhinitis: Secondary | ICD-10-CM | POA: Diagnosis not present

## 2020-09-25 DIAGNOSIS — J4540 Moderate persistent asthma, uncomplicated: Secondary | ICD-10-CM

## 2020-09-25 DIAGNOSIS — L2089 Other atopic dermatitis: Secondary | ICD-10-CM

## 2020-09-25 MED ORDER — DUPILUMAB 300 MG/2ML ~~LOC~~ SOSY
300.0000 mg | PREFILLED_SYRINGE | Freq: Once | SUBCUTANEOUS | Status: AC
  Administered 2020-09-25: 300 mg via SUBCUTANEOUS

## 2020-09-25 MED ORDER — ALBUTEROL SULFATE HFA 108 (90 BASE) MCG/ACT IN AERS: 2.0000 | INHALATION_SPRAY | Freq: Four times a day (QID) | RESPIRATORY_TRACT | 3 refills | Status: DC | PRN

## 2020-09-25 NOTE — Patient Instructions (Addendum)
  1.  Allergen avoidance measures - Dust mite, cat, dog, pollens  2.  Treat and prevent inflammation:   A. Symbicort 160 - 2 inhalations 2 times per day  B. flonase - 1 spray each nostril 2 times per day  C. Start Dupilumab 300 mg every week (EOE)  D. Discontinue budesonide slurry  3.  Continue omeprazole 40 mg - 1 tablet twice a day  4.  If needed:   A. Albuterol HFA - 2 inhalations every 4-6 hours  B. OTC antihistamine - Claritin / Zrytec 10 mg - 1 tablet 1 time per day  C. Mometasone 0.1% cream applied to eczema 1 time per day  5.  Return to clinic in 12 weeks or earlier if problem

## 2020-09-25 NOTE — Progress Notes (Signed)
Immunotherapy   Patient Details  Name: ARMONI DEPASS MRN: [id number] Date of Birth: 04-27-1970  09/25/2020  Renae Fickle received sample of Dupixent for EOE. Patient will be administering Dupixent injections at home every week. Teaching was provided and patient self administer Dupixent in office today. Patient did not have any issues. Another sample was provided for patient to use next week.  Consent signed and patient instructions given.   Deborra Medina 09/25/2020, 4:20 PM

## 2020-09-25 NOTE — Progress Notes (Signed)
[place] - [place] - [place] - [name] - [place]   Follow-up Note  Referring Provider: No ref. provider found Primary Provider: Patient, No Pcp Per (Inactive) Date of Office Visit: 09/25/2020  Subjective:   Dustin Nichols (DOB: 1970/05/28) is a 50 y.o. male who [facility] 09/25/2020 in re-evaluation of the following:  HPI: Pharell returns to this clinic in evaluation of multiorgan eosinophilic driven disease including asthma, allergic rhinoconjunctivitis, atopic dermatitis, and eosinophilic esophagitis.  His last visit to this clinic was 27 March 2020.  Overall he thinks his asthma is doing relatively well.  He does not really use his bronchodilator that often.  Occasionally does have some wheezing and coughing and some congestion in his chest and he is just adapted to this issue.  This occurs even though he has been consistently using his combination inhaler.  He continues to have food that takes a long time to go down his esophagus.  He will get stuck for several seconds and then progresses.  He has not had any obstruction that is lasted greater than a minute.  This occurs about 3 times per week.  This is occurring even though he uses his budesonide slurry daily.  He continues on omeprazole.  His nose is doing relatively well while using Flonase yet he still continues to have persistent nasal congestion on most days.  He has had very little issues with his eczema.  Allergies as of 09/25/2020       Reactions   Amoxicillin    REACTION: Rash        Medication List    albuterol 108 (90 Base) MCG/ACT inhaler Commonly known as: VENTOLIN HFA Inhale 2 puffs into the lungs every 6 (six) hours as needed for wheezing or shortness of breath.   budesonide 0.5 MG/2ML nebulizer solution Commonly known as: PULMICORT MIX THE CONTENTS OF ONE AMPULE WITH TEN PACKETS OF SPLENDA AND TAKE BY MOUTH DAILY   budesonide-formoterol 160-4.5 MCG/ACT  inhaler Commonly known as: Symbicort Inhale two puffs twice daily to prevent cough or wheeze.  Rinse, gargle, and spit after use.   cetirizine 10 MG tablet Commonly known as: ZYRTEC Take 10 mg by mouth daily.   fluticasone 50 MCG/ACT nasal spray Commonly known as: FLONASE SPRAY 2 SPRAYS INTO EACH NOSTRIL EVERY DAY   mometasone 0.1 % cream Commonly known as: ELOCON Can apply to eczema once daily if needed.   omeprazole 40 MG capsule Commonly known as: PRILOSEC Take 1 capsule (40 mg total) by mouth 2 (two) times daily.        Past Medical History:  Diagnosis Date   Asthma    Depression    Eosinophilic esophagitis    GERD (gastroesophageal reflux disease)    Hiatal hernia    Hypertension    Peptic stricture of esophagus     Past Surgical History:  Procedure Laterality Date   SURGERY SCROTAL / TESTICULAR     blockage   UPPER GASTROINTESTINAL ENDOSCOPY      Review of systems negative except as noted in HPI / PMHx or noted below:  Review of Systems  Constitutional: Negative.   HENT: Negative.    Eyes: Negative.   Respiratory: Negative.    Cardiovascular: Negative.   Gastrointestinal: Negative.   Genitourinary: Negative.   Musculoskeletal: Negative.   Skin: Negative.   Neurological: Negative.   Endo/Heme/Allergies: Negative.   Psychiatric/Behavioral: Negative.      Objective:   Vitals:   09/25/20  1535  BP: 124/86  Pulse: 84  Resp: 16  SpO2: 95%   Height: 5' 6.73" (169.5 cm)  Weight: 214 lb 6.4 oz (97.3 kg)   Physical Exam Constitutional:      Appearance: He is not diaphoretic.  HENT:     Head: Normocephalic.     Right Ear: Tympanic membrane, ear canal and external ear normal.     Left Ear: Tympanic membrane, ear canal and external ear normal.     Nose: Mucosal edema present. No rhinorrhea.     Mouth/Throat:     Pharynx: Uvula midline. No oropharyngeal exudate.  Eyes:     Conjunctiva/sclera: Conjunctivae normal.  Neck:     Thyroid: No  thyromegaly.     Trachea: Trachea normal. No tracheal tenderness or tracheal deviation.  Cardiovascular:     Rate and Rhythm: Normal rate and regular rhythm.     Heart sounds: Normal heart sounds, S1 normal and S2 normal. No murmur heard. Pulmonary:     Effort: No respiratory distress.     Breath sounds: No stridor. Wheezing (Bilateral expiratory wheezing both posterior lung fields) present. No rales.  Lymphadenopathy:     Head:     Right side of head: No tonsillar adenopathy.     Left side of head: No tonsillar adenopathy.     Cervical: No cervical adenopathy.  Skin:    Findings: No erythema or rash.     Nails: There is no clubbing.  Neurological:     Mental Status: He is alert.    Diagnostics:    Spirometry was performed and demonstrated an FEV1 of 3.74 at 106 % of predicted.  Assessment and Plan:   1. Eosinophilic esophagitis   2. Not well controlled moderate persistent asthma   3. Perennial allergic rhinitis   4. Seasonal allergic rhinitis due to pollen   5. Other atopic dermatitis     1.  Allergen avoidance measures - Dust mite, cat, dog, pollens  2.  Treat and prevent inflammation:   A. Symbicort 160 - 2 inhalations 2 times per day  B. flonase - 1 spray each nostril 2 times per day  C. Start Dupilumab 300 mg every week (EOE)  D. Discontinue budesonide slurry  3.  Continue omeprazole 40 mg - 1 tablet twice a day  4.  If needed:   A. Albuterol HFA - 2 inhalations every 4-6 hours  B. OTC antihistamine - Claritin / Zrytec 10 mg - 1 tablet 1 time per day  C. Mometasone 0.1% cream applied to eczema 1 time per day  5.  Return to clinic in 12 weeks or earlier if problem  Rodgerick has started dupilumab to treat his multiorgan atopic eosinophilic driven disease that involves his respiratory track, skin, and esophagus.  He will continue on the plan of anti-inflammatory agents for his respiratory tract and therapy directed against reflux as noted above while he continues  on dupilumab and we will see him back in this clinic in 12 weeks to assess his response to this approach.  Laurette Schimke, MD Allergy / Immunology [facility]

## 2020-09-25 NOTE — Telephone Encounter (Signed)
Patient received sample of Dupixent in office today for EOE. Teaching was provided and another sample was given to patient for him to administer at home next week. Consent was signed.

## 2020-09-26 ENCOUNTER — Encounter: Payer: Self-pay | Admitting: Allergy and Immunology

## 2020-10-02 NOTE — Telephone Encounter (Signed)
Patient called and advised need another sample while I work on approval. Patient wife to come by today to pick same up

## 2020-10-04 MED ORDER — DUPIXENT 300 MG/2ML ~~LOC~~ SOSY: 300.0000 mg | PREFILLED_SYRINGE | SUBCUTANEOUS | 11 refills | Status: DC

## 2020-10-04 NOTE — Addendum Note (Signed)
Addended by: Devoria Glassing on: 10/04/2020 09:58 AM   Modules accepted: Orders

## 2020-10-04 NOTE — Telephone Encounter (Signed)
L/M for patient to contact me to advise approval and submit to [facility] pharmacy and [facility] was advised by [employer] as pharmacy to have to use

## 2020-10-04 NOTE — Telephone Encounter (Signed)
Spoke to patient and advised approval and submit to [facility] and to call to get copay info. Advised storage and dosing instrux

## 2020-10-09 ENCOUNTER — Other Ambulatory Visit: Payer: Self-pay | Admitting: Allergy and Immunology

## 2020-11-29 ENCOUNTER — Telehealth: Payer: Self-pay | Admitting: Gastroenterology

## 2020-11-29 NOTE — Telephone Encounter (Signed)
Inbound call from patient requesting medication be called into pharmacy [facility] in [place]. States he feel an acid build up at top of stomach, bottom of chest.

## 2020-11-29 NOTE — Telephone Encounter (Signed)
Patient states he has had some increase in acid coming back into his esophagus at night. Confirmed with patient that he is taking his omeprazole twice daily. Patient advised to avoid spicy, acidic, citrus, chocolate, mints, fruit and fruit juices.  Limit the intake of caffeine, alcohol and Soda.  Don't exercise too soon after eating.  Don't lie down within 3-4 hours of eating.  Elevate the head of your bed. Also, informed patient to add OTC famotidine at bedtime until appt with Dr. Russella Dar. Appt scheduled for 12/31/20. Patient verbalized understanding.

## 2020-12-18 ENCOUNTER — Other Ambulatory Visit: Payer: Self-pay

## 2020-12-18 ENCOUNTER — Encounter: Payer: Self-pay | Admitting: Allergy and Immunology

## 2020-12-18 ENCOUNTER — Ambulatory Visit (INDEPENDENT_AMBULATORY_CARE_PROVIDER_SITE_OTHER): Payer: 59 | Admitting: Allergy and Immunology

## 2020-12-18 VITALS — BP 142/86 | HR 92 | Resp 16

## 2020-12-18 DIAGNOSIS — K200 Eosinophilic esophagitis: Secondary | ICD-10-CM

## 2020-12-18 DIAGNOSIS — J4540 Moderate persistent asthma, uncomplicated: Secondary | ICD-10-CM | POA: Diagnosis not present

## 2020-12-18 DIAGNOSIS — K2100 Gastro-esophageal reflux disease with esophagitis, without bleeding: Secondary | ICD-10-CM

## 2020-12-18 DIAGNOSIS — J301 Allergic rhinitis due to pollen: Secondary | ICD-10-CM

## 2020-12-18 DIAGNOSIS — J3089 Other allergic rhinitis: Secondary | ICD-10-CM

## 2020-12-18 DIAGNOSIS — L2089 Other atopic dermatitis: Secondary | ICD-10-CM

## 2020-12-18 DIAGNOSIS — G4452 New daily persistent headache (NDPH): Secondary | ICD-10-CM

## 2020-12-18 NOTE — Progress Notes (Signed)
[place] - [place] - [place] - [name] - [place]   Follow-up Note  Referring Provider: No ref. provider found Primary Provider: Patient, No Pcp Per (Inactive) Date of Office Visit: 12/18/2020  Subjective:   Dustin Nichols (DOB: 04-Sep-1970) is a 50 y.o. male who [facility] on 12/18/2020 in re-evaluation of the following:  HPI: Dustin Nichols returns to this clinic in evaluation of multiorgan eosinophilic driven disease including asthma, allergic rhinoconjunctivitis, atopic dermatitis, and eosinophilic esophagitis.  His last visit to this clinic was 25 September 2020.  His asthma is doing relatively well and he does not need to use a short acting bronchodilator and has had very little problems with his chest.  His nose has also been doing relatively well but he has noticed some stuffiness of his nose on a pretty consistent basis without any anosmia or decreased ability to taste.  He does not really have that much sneezing.  His swallowing is okay and that he has not had any obstruction.  He still occasionally has slow transit of food through his esophagus.  He has been having rather significant reflux recently with regurgitation the past several weeks.  It should be noted that he drinks 6-8 Mountain Dew's per day.  He has had very little problems with atopic dermatitis and does not use any topical agent.  He has been waking up with a forehead headache about 3 times per week.  This usually resolves after several hours.  Difficult to say exactly how long this has been going on but certainly it has been for months.  He only sleeps about 4 to 5 hours per night as he gets up very early in the morning.  As noted above he drinks 6-8 caffeinated drinks per day.  He does occasionally have gasping events at nighttime according to his wife's observation.  Allergies as of 12/18/2020       Reactions   Amoxicillin    REACTION: Rash        Medication List    albuterol  108 (90 Base) MCG/ACT inhaler Commonly known as: VENTOLIN HFA Inhale 2 puffs into the lungs every 6 (six) hours as needed for wheezing or shortness of breath.   budesonide-formoterol 160-4.5 MCG/ACT inhaler Commonly known as: Symbicort Inhale two puffs twice daily to prevent cough or wheeze.  Rinse, gargle, and spit after use.   cetirizine 10 MG tablet Commonly known as: ZYRTEC Take 10 mg by mouth daily.   Dupixent 300 MG/2ML prefilled syringe Generic drug: dupilumab Inject 300 mg into the skin every 7 (seven) days.   fluticasone 50 MCG/ACT nasal spray Commonly known as: FLONASE SPRAY 2 SPRAYS INTO EACH NOSTRIL EVERY DAY   mometasone 0.1 % cream Commonly known as: ELOCON Can apply to eczema once daily if needed.   omeprazole 40 MG capsule Commonly known as: PRILOSEC Take 1 capsule (40 mg total) by mouth 2 (two) times daily.    Past Medical History:  Diagnosis Date   Asthma    Depression    Eosinophilic esophagitis    GERD (gastroesophageal reflux disease)    Hiatal hernia    Hypertension    Peptic stricture of esophagus     Past Surgical History:  Procedure Laterality Date   SURGERY SCROTAL / TESTICULAR     blockage   UPPER GASTROINTESTINAL ENDOSCOPY      Review of systems negative except as noted in HPI / PMHx or noted below:  Review of Systems  Constitutional:  Negative.   HENT: Negative.    Eyes: Negative.   Respiratory: Negative.    Cardiovascular: Negative.   Gastrointestinal: Negative.   Genitourinary: Negative.   Musculoskeletal: Negative.   Skin: Negative.   Neurological: Negative.   Endo/Heme/Allergies: Negative.   Psychiatric/Behavioral: Negative.      Objective:   Vitals:   12/18/20 1454  BP: (!) 142/86  Pulse: 92  Resp: 16  SpO2: 95%          Physical Exam Constitutional:      Appearance: He is not diaphoretic.  HENT:     Head: Normocephalic.     Right Ear: Tympanic membrane, ear canal and external ear normal.     Left Ear:  Tympanic membrane, ear canal and external ear normal.     Nose: Nose normal. No mucosal edema or rhinorrhea.     Mouth/Throat:     Pharynx: Uvula midline. No oropharyngeal exudate.  Eyes:     Conjunctiva/sclera: Conjunctivae normal.  Neck:     Thyroid: No thyromegaly.     Trachea: Trachea normal. No tracheal tenderness or tracheal deviation.  Cardiovascular:     Rate and Rhythm: Normal rate and regular rhythm.     Heart sounds: Normal heart sounds, S1 normal and S2 normal. No murmur heard. Pulmonary:     Effort: No respiratory distress.     Breath sounds: Normal breath sounds. No stridor. No wheezing or rales.  Lymphadenopathy:     Head:     Right side of head: No tonsillar adenopathy.     Left side of head: No tonsillar adenopathy.     Cervical: No cervical adenopathy.  Skin:    Findings: No erythema or rash.     Nails: There is no clubbing.  Neurological:     Mental Status: He is alert.    Diagnostics:    Spirometry was performed and demonstrated an FEV1 of 3.23 at 92 % of predicted.  Assessment and Plan:   1. Eosinophilic esophagitis   2. Gastroesophageal reflux disease with esophagitis without hemorrhage   3. Asthma, moderate persistent, well-controlled   4. Perennial allergic rhinitis   5. Seasonal allergic rhinitis due to pollen   6. Other atopic dermatitis   7. New daily persistent headache     1.  Allergen avoidance measures - Dust mite, cat, dog, pollens  2.  Treat and prevent inflammation:   A. DECREASE Symbicort 160 - 2 inhalations 1 time per day  B. Dupilumab 300 mg every week (EOE)  C. OTC Rhinocort - 1 spray each nostril 2 times per day  D. Azelastine - 1 spray each nostril 2 times per day  3.  Treat reflux:  A. Omeprazole 40 mg - 1 tablet twice a day B. Slowly consolidate caffeine consumption  4. Treat headache:   A. Slowly consolidate caffeine consumption  B. Attempt more hours or sleeep per night  C. Evaluation for sleep apnea???  4.  If  needed:   A. Albuterol HFA - 2 inhalations every 4-6 hours  B. OTC antihistamine - Claritin / Zrytec 10 mg - 1 tablet 1 time per day  C. Mometasone 0.1% cream applied to eczema 1 time per day  5.  Return to clinic in 12 weeks or earlier if problem  6. Obtain fall flu vaccine  Dustin Nichols is having very significant reflux and is having headaches and the first approach to this issue is to have him taper down his rather extensive caffeine use over the course of  the next month.  I have informed him that if he still continues to have headaches while tapering down his caffeine then we may need to have him evaluated for possible sleep apnea and refer him to a neurologist.  He is going to require further evaluation with his gastroenterologist if he continues to have bad reflux symptoms while tapering down his caffeine use.  He may require a additional upper endoscopy to see if dupilumab has resulting in elimination of his esophageal eosinophils.  He will continue to utilize anti-inflammatory agents for his airway as noted above and I think there is an opportunity to consolidate some of his Symbicort use today.  I will see him back in his clinic in 12 weeks or earlier if there is a problem.  Laurette Schimke, MD Allergy / Immunology [facility]

## 2020-12-18 NOTE — Patient Instructions (Signed)
  1.  Allergen avoidance measures - Dust mite, cat, dog, pollens  2.  Treat and prevent inflammation:   A. DECREASE Symbicort 160 - 2 inhalations 1 time per day  B. Dupilumab 300 mg every week (EOE)  C. OTC Rhinocort - 1 spray each nostril 2 times per day  D. Azelastine - 1 spray each nostril 2 times per day  3.  Treat reflux:  A. Omeprazole 40 mg - 1 tablet twice a day B. Slowly consolidate caffeine consumption  4. Treat headache:   A. Slowly consolidate caffeine consumption  B. Attempt more hours or sleeep per night  C. Evaluation for sleep apnea???  4.  If needed:   A. Albuterol HFA - 2 inhalations every 4-6 hours  B. OTC antihistamine - Claritin / Zrytec 10 mg - 1 tablet 1 time per day  C. Mometasone 0.1% cream applied to eczema 1 time per day  5.  Return to clinic in 12 weeks or earlier if problem  6. Obtain fall flu vaccine

## 2020-12-19 ENCOUNTER — Encounter: Payer: Self-pay | Admitting: Allergy and Immunology

## 2020-12-19 NOTE — Addendum Note (Signed)
Addended by: Deborra Medina on: 12/19/2020 05:49 PM   Modules accepted: Orders

## 2020-12-31 ENCOUNTER — Ambulatory Visit: Payer: 59 | Admitting: Gastroenterology

## 2021-02-11 ENCOUNTER — Ambulatory Visit (INDEPENDENT_AMBULATORY_CARE_PROVIDER_SITE_OTHER): Payer: 59 | Admitting: Gastroenterology

## 2021-02-11 ENCOUNTER — Encounter: Payer: Self-pay | Admitting: Gastroenterology

## 2021-02-11 VITALS — BP 130/82 | HR 101 | Ht 68.0 in | Wt 219.0 lb

## 2021-02-11 DIAGNOSIS — K200 Eosinophilic esophagitis: Secondary | ICD-10-CM | POA: Diagnosis not present

## 2021-02-11 DIAGNOSIS — Z1212 Encounter for screening for malignant neoplasm of rectum: Secondary | ICD-10-CM | POA: Diagnosis not present

## 2021-02-11 DIAGNOSIS — Z1211 Encounter for screening for malignant neoplasm of colon: Secondary | ICD-10-CM | POA: Diagnosis not present

## 2021-02-11 DIAGNOSIS — K219 Gastro-esophageal reflux disease without esophagitis: Secondary | ICD-10-CM

## 2021-02-11 MED ORDER — DICYCLOMINE HCL 10 MG PO CAPS: 10.0000 mg | ORAL_CAPSULE | Freq: Three times a day (TID) | ORAL | 11 refills | Status: DC

## 2021-02-11 MED ORDER — PANTOPRAZOLE SODIUM 40 MG PO TBEC: 40.0000 mg | DELAYED_RELEASE_TABLET | Freq: Two times a day (BID) | ORAL | 11 refills | Status: DC

## 2021-02-11 MED ORDER — FAMOTIDINE 40 MG PO TABS: 40.0000 mg | ORAL_TABLET | Freq: Every day | ORAL | 11 refills | Status: DC

## 2021-02-11 NOTE — Patient Instructions (Signed)
We have sent the following medications to your pharmacy for you to pick up at your convenience: pantoprazole 40 mg twice daily before meals, famotidine 40 mg at bedtime and dicyclomine 10 mg three times a day before meals.  The [facility] providers would like to encourage you to use [facility] to communicate with providers for non-urgent requests or questions.  Due to long hold times on the telephone, sending your provider a message by [facility] may be a faster and more efficient way to get a response.  Please allow 48 business hours for a response.  Please remember that this is for non-urgent requests.   Thank you for choosing me and  Gastroenterology.  Venita Lick. Pleas Koch., MD., Clementeen Graham

## 2021-02-11 NOTE — Progress Notes (Signed)
    History of Present Illness: This is a 50 year old male with a history of GERD and eosinophilic esophagitis.  His last GI office visit was in January 2021.  His reflux symptoms have been active.  He relates a substernal chest discomfort and occasional regurgitation.  Famotidine at bedtime was added in September and it seemed to help however his symptoms persist.  He notes frequent dysphagia with meats and breads unless he has small bites.  He notes intermittent abdominal bloating and gurgling rumbling sensation in his abdomen at times.  He is followed by Dr. Laurette Schimke.  Current Medications, Allergies, Past Medical History, Past Surgical History, Family History and Social History were reviewed in [employer] record.   Physical Exam: General: Well developed, well nourished, no acute distress Head: Normocephalic and atraumatic Eyes: Sclerae anicteric, EOMI Ears: Normal auditory acuity Mouth: Not examined, mask on during Covid-19 pandemic Lungs: Clear throughout to auscultation Heart: Regular rate and rhythm; no murmurs, rubs or bruits Abdomen: Soft, non tender and non distended. No masses, hepatosplenomegaly or hernias noted. Normal Bowel sounds Rectal: Not done Musculoskeletal: Symmetrical with no gross deformities  Pulses:  Normal pulses noted Extremities: No clubbing, cyanosis, edema or deformities noted Neurological: Alert oriented x 4, grossly nonfocal Psychological:  Alert and cooperative. Normal mood and affect   Assessment and Recommendations:  GERD and eosinophilic esophagitis.  Dysphagia to meat and bread.  His reflux symptoms are not adequately controlled.  Changed to pantoprazole 40 mg p.o. twice daily taken 30 minutes before breakfast and lunch.  Famotidine 40 mg p.o. at bedtime.  Closely follow all antireflux measures.  Tums as needed for breakthrough symptoms.  We discussed proceeding with EGD / esophageal dilation however he would like to wait to see  if his symptoms improve with the change in reflux treatment.  He is currently treated with Dupixent 300 mg SQ q7 days for EoE.  REV in 6 weeks.  Abdominal bloating, gurgling.  Avoid foods and beverages that exacerbate symptoms.  Trial of dicyclomine 10 mg p.o. 3 times daily as needed. Asthma, perennial allergic rhinitis, seasonal allergic rhinitis, atopic dermatitis. CRC screening, average risk.  Recommended proceeding with screening colonoscopy when his reflux symptoms are under better control, hopefully at his REV in 6 weeks.

## 2021-02-12 ENCOUNTER — Telehealth: Payer: Self-pay | Admitting: Gastroenterology

## 2021-02-12 MED ORDER — DICYCLOMINE HCL 10 MG PO CAPS: 10.0000 mg | ORAL_CAPSULE | Freq: Three times a day (TID) | ORAL | 11 refills | Status: DC

## 2021-02-12 NOTE — Telephone Encounter (Signed)
Patient states dicyclomine was unavailable at the [facility] in [place], [place]. Patient states the pharmacist was unsure when they would be getting the prescription in stock. Offered patient to send to a different pharmacy or switch to another medication. Patient states we can try to send the rx to [facility] in [place] instead. Prescription sent.

## 2021-03-12 ENCOUNTER — Ambulatory Visit: Payer: 59 | Admitting: Allergy and Immunology

## 2021-03-27 ENCOUNTER — Ambulatory Visit (INDEPENDENT_AMBULATORY_CARE_PROVIDER_SITE_OTHER): Payer: 59 | Admitting: Allergy and Immunology

## 2021-03-27 ENCOUNTER — Other Ambulatory Visit: Payer: Self-pay

## 2021-03-27 ENCOUNTER — Encounter: Payer: Self-pay | Admitting: Allergy and Immunology

## 2021-03-27 VITALS — BP 160/90 | HR 80 | Resp 16 | Ht 67.3 in | Wt 224.4 lb

## 2021-03-27 DIAGNOSIS — K2100 Gastro-esophageal reflux disease with esophagitis, without bleeding: Secondary | ICD-10-CM | POA: Diagnosis not present

## 2021-03-27 DIAGNOSIS — I10 Essential (primary) hypertension: Secondary | ICD-10-CM

## 2021-03-27 DIAGNOSIS — J4540 Moderate persistent asthma, uncomplicated: Secondary | ICD-10-CM

## 2021-03-27 DIAGNOSIS — J3089 Other allergic rhinitis: Secondary | ICD-10-CM

## 2021-03-27 DIAGNOSIS — D7219 Other eosinophilia: Secondary | ICD-10-CM | POA: Diagnosis not present

## 2021-03-27 DIAGNOSIS — J301 Allergic rhinitis due to pollen: Secondary | ICD-10-CM

## 2021-03-27 DIAGNOSIS — K200 Eosinophilic esophagitis: Secondary | ICD-10-CM

## 2021-03-27 MED ORDER — BUDESONIDE-FORMOTEROL FUMARATE 160-4.5 MCG/ACT IN AERO: INHALATION_SPRAY | RESPIRATORY_TRACT | 5 refills | Status: DC

## 2021-03-27 MED ORDER — RYALTRIS 665-25 MCG/ACT NA SUSP: 2.0000 | Freq: Two times a day (BID) | NASAL | 5 refills | Status: DC

## 2021-03-27 NOTE — Patient Instructions (Addendum)
°  1.  Allergen avoidance measures - Dust mite, cat, dog, pollens  2.  Treat and prevent inflammation:   A. Dupilumab 300 mg every week  B. Symbicort 160 - 2 inhalations 1-2 times per day  C. Ryaltris - 2 sprays each nostril 2 times per day (specialty pharmacy)  3.  Treat reflux:  A. Pantoprazole 40 mg - 1 tablet twice a day B. Famotidine 40 mg - 1 tablet in evening C.  Minimize caffeine consumption  4. Treat headache:   A.  Minimize caffeine consumption  5.  If needed:   A. Albuterol HFA - 2 inhalations every 4-6 hours  B. OTC antihistamine - Claritin / Zrytec 10 mg - 1 tablet 1 time per day  C. Mometasone 0.1% cream applied to eczema 1 time per day  6. Consider starting a course of immunotherapy  7. Visit with Dr. Russella Dar Monday. Restart budesonide slurry???  8. Blood - CBC w/d  9. Return to clinic in 12 weeks or earlier if problem  10. Check blood pressure: BP=160/90 today

## 2021-03-27 NOTE — Progress Notes (Signed)
[name]   Follow-up Note  Referring Provider: No ref. provider found Primary Provider: Patient, No Pcp Per (Inactive) Date of Office Visit: 03/27/2021  Subjective:   Renae Ficklehomas M Ardoin (DOB: May 03, 1970) is a 51 y.o. male who [facility] in re-evaluation of the following:  HPI: Maisie Fushomas returns to this clinic in reevaluation of asthma, allergic rhinoconjunctivitis, atopic dermatitis, and eosinophilic esophagitis.  His last visit to this clinic was 18 December 2020.  His asthma is doing relatively well and he has been able to taper down his Symbicort to 1 time per day and he rarely uses a short acting bronchodilator and he rarely has any bronchospastic symptoms and has not required a systemic steroid to treat an exacerbation of asthma.  His nose still remains with some nasal congestion.  He has not required an antibiotic to treat an episode of sinusitis.  His reflux is out of control and he still has some very slow swallowing and sometimes obstruction.  He is scheduled to see his gastroenterologist on Monday.  He continues to use dupilumab.  He has consolidated his caffeine from 6 caffeinated drinks per day to 2 caffeinated drinks per day.  He has not had any headaches.  As noted above he is tapering down his caffeine use.  His atopic dermatitis is under excellent control at this point while intermittently using topical steroid usually 1 or 2 times per week.  Allergies as of 03/27/2021       Reactions   Amoxicillin    REACTION: Rash        Medication List    albuterol 108 (90 Base) MCG/ACT inhaler Commonly known as: VENTOLIN HFA Inhale 2 puffs into the lungs every 6 (six) hours as needed for wheezing or shortness of breath.   aspirin EC 81 MG tablet Take 81 mg by mouth daily. Swallow whole.   budesonide-formoterol 160-4.5 MCG/ACT inhaler Commonly known as: Symbicort Inhale two puffs  twice daily to prevent cough or wheeze.  Rinse, gargle, and spit after use.   cetirizine 10 MG tablet Commonly known as: ZYRTEC Take 10 mg by mouth daily.   dicyclomine 10 MG capsule Commonly known as: BENTYL Take 1 capsule (10 mg total) by mouth 3 (three) times daily before meals.   Dupixent 300 MG/2ML prefilled syringe Generic drug: dupilumab Inject 300 mg into the skin every 7 (seven) days.   mometasone 0.1 % cream Commonly known as: ELOCON Can apply to eczema once daily if needed.   MULTIVITAMIN PO Take by mouth daily.   pantoprazole 40 MG tablet Commonly known as: PROTONIX Take 1 tablet (40 mg total) by mouth 2 (two) times daily before a meal.    Past Medical History:  Diagnosis Date   Asthma    Depression    Eosinophilic esophagitis    GERD (gastroesophageal reflux disease)    Hiatal hernia    Hypertension    Peptic stricture of esophagus     Past Surgical History:  Procedure Laterality Date   SURGERY SCROTAL / TESTICULAR     blockage   UPPER GASTROINTESTINAL ENDOSCOPY      Review of systems negative except as noted in HPI / PMHx or noted below:  Review of Systems  Constitutional: Negative.   HENT: Negative.    Eyes: Negative.   Respiratory: Negative.    Cardiovascular: Negative.   Gastrointestinal: Negative.   Genitourinary: Negative.   Musculoskeletal: Negative.  Skin: Negative.   Neurological: Negative.   Endo/Heme/Allergies: Negative.   Psychiatric/Behavioral: Negative.      Objective:   Vitals:   03/27/21 1537 03/27/21 1552  BP: (!) 162/112 (!) 160/90  Pulse: 80   Resp: 16   SpO2: 95%    Height: 5' 7.3" (170.9 cm)  Weight: 224 lb 6.4 oz (101.8 kg)   Physical Exam Constitutional:      Appearance: He is not diaphoretic.  HENT:     Head: Normocephalic.     Right Ear: Tympanic membrane, ear canal and external ear normal.     Left Ear: Tympanic membrane, ear canal and external ear normal.     Nose: Nose normal. No mucosal edema or  rhinorrhea.     Mouth/Throat:     Pharynx: Uvula midline. No oropharyngeal exudate.  Eyes:     Conjunctiva/sclera: Conjunctivae normal.  Neck:     Thyroid: No thyromegaly.     Trachea: Trachea normal. No tracheal tenderness or tracheal deviation.  Cardiovascular:     Rate and Rhythm: Normal rate and regular rhythm.     Heart sounds: Normal heart sounds, S1 normal and S2 normal. No murmur heard. Pulmonary:     Effort: No respiratory distress.     Breath sounds: Normal breath sounds. No stridor. No wheezing or rales.  Lymphadenopathy:     Head:     Right side of head: No tonsillar adenopathy.     Left side of head: No tonsillar adenopathy.     Cervical: No cervical adenopathy.  Skin:    Findings: No erythema or rash.     Nails: There is no clubbing.  Neurological:     Mental Status: He is alert.    Diagnostics:    Spirometry was performed and demonstrated an FEV1 of 3.09 at 88 % of predicted.   Assessment and Plan:   1. Other eosinophilia   2. Asthma, moderate persistent, well-controlled   3. Perennial allergic rhinitis   4. Seasonal allergic rhinitis due to pollen   5. Gastroesophageal reflux disease with esophagitis without hemorrhage   6. Eosinophilic esophagitis   7. Hypertension, essential     1.  Allergen avoidance measures - Dust mite, cat, dog, pollens  2.  Treat and prevent inflammation:   A. Dupilumab 300 mg every week  B. Symbicort 160 - 2 inhalations 1-2 times per day  C. Ryaltris - 2 sprays each nostril 2 times per day (specialty pharmacy)  3.  Treat reflux:  A. Pantoprazole 40 mg - 1 tablet twice a day B. Famotidine 40 mg - 1 tablet in evening C.  Minimize caffeine consumption  4. Treat headache:   A.  Minimize caffeine consumption  5.  If needed:   A. Albuterol HFA - 2 inhalations every 4-6 hours  B. OTC antihistamine - Claritin / Zrytec 10 mg - 1 tablet 1 time per day  C. Mometasone 0.1% cream applied to eczema 1 time per day  6. Consider  starting a course of immunotherapy  7. Visit with Dr. Russella Dar Monday. Restart budesonide slurry???  8. Blood - CBC w/d  9. Return to clinic in 12 weeks or earlier if problem  10. Check blood pressure: BP=160/90 today  Nadir appears to be doing okay from a respiratory standpoint especially his asthma but he still has some activity of his upper airway atopic disease and he would be a candidate for immunotherapy if he fails medical therapy.  I will give him a different form of  combination nasal inhaler as noted above.  His skin is doing relatively well.  His eosinophilic esophagitis and reflux are very active and I am not sure that they are responding to dupilumab adequately.  He may need to go back on his budesonide slurry.  He will be visiting with his gastroenterologist Monday to discuss this issue.  He has significant eosinophilia documented in the past and we will check a CBC with differential to see if this has increased.  If so, he may benefit from an anti-IL-5 biologic agent to replace his anti-IL-4/13 biologic agent.  We also informed him about his blood pressure issue and asked him to follow-up with his primary care doctor regarding this issue.  Laurette Schimke, MD Allergy / Immunology [facility]

## 2021-03-31 ENCOUNTER — Encounter: Payer: Self-pay | Admitting: Allergy and Immunology

## 2021-03-31 ENCOUNTER — Encounter: Payer: Self-pay | Admitting: Gastroenterology

## 2021-03-31 ENCOUNTER — Ambulatory Visit (INDEPENDENT_AMBULATORY_CARE_PROVIDER_SITE_OTHER): Payer: 59 | Admitting: Gastroenterology

## 2021-03-31 VITALS — BP 144/100 | HR 108 | Ht 67.0 in | Wt 223.5 lb

## 2021-03-31 DIAGNOSIS — Z1211 Encounter for screening for malignant neoplasm of colon: Secondary | ICD-10-CM

## 2021-03-31 DIAGNOSIS — R1319 Other dysphagia: Secondary | ICD-10-CM

## 2021-03-31 DIAGNOSIS — Z1212 Encounter for screening for malignant neoplasm of rectum: Secondary | ICD-10-CM

## 2021-03-31 DIAGNOSIS — K200 Eosinophilic esophagitis: Secondary | ICD-10-CM | POA: Diagnosis not present

## 2021-03-31 DIAGNOSIS — K219 Gastro-esophageal reflux disease without esophagitis: Secondary | ICD-10-CM

## 2021-03-31 MED ORDER — FAMOTIDINE 40 MG PO TABS: 40.0000 mg | ORAL_TABLET | Freq: Every day | ORAL | 11 refills | Status: DC

## 2021-03-31 NOTE — Progress Notes (Signed)
° ° °  History of Present Illness: This is a 51 year old male with a history of GERD, eosinophilic esophagitis, history of esophageal strictures who relates worsening problems with solid food dysphagia and intermittent epigastric pain, left upper quadrant pain and left lateral chest pain.  He also describes an intermittent gurgling sensation or gas sensation in his epigastrium.  Dicyclomine did not help his abdominal pain pain, left chest pain or gurgling sensation.  Current Medications, Allergies, Past Medical History, Past Surgical History, Family History and Social History were reviewed in [employer] record.   Physical Exam: General: Well developed, well nourished, no acute distress Head: Normocephalic and atraumatic Eyes: Sclerae anicteric, EOMI Ears: Normal auditory acuity Mouth: Not examined, mask on during Covid-19 pandemic Lungs: Clear throughout to auscultation Heart: Regular rate and rhythm; no murmurs, rubs or bruits Abdomen: Soft, non tender and non distended. No masses, hepatosplenomegaly or hernias noted. Normal Bowel sounds Rectal: Not done  Musculoskeletal: Symmetrical with no gross deformities  Pulses:  Normal pulses noted Extremities: No clubbing, cyanosis, edema or deformities noted Neurological: Alert oriented x 4, grossly nonfocal Psychological:  Alert and cooperative. Normal mood and affect   Assessment and Recommendations:  Dysphagia, history of esophageal stricture, GERD, EoE, epigastric pain, LUQ pain, left lateral chest pain and gas.  DC dicyclomine.  Continue pantoprazole 40 mg p.o. twice daily taken 30 to 60 minutes before meals and famotidine 40 mg taken at bedtime.  Continue Dupixent.  Begin Gas-X or Gaviscon 4 times daily as needed.  Schedule EGD with possible dilation and possible biopsies. The risks (including bleeding, perforation, infection, missed lesions, medication reactions and possible hospitalization or surgery if complications  occur), benefits, and alternatives to endoscopy with possible biopsy and possible dilation were discussed with the patient and they consent to proceed.   CRC screening, average risk. Recommended he proceed with colonoscopy at the time of his EGD however he is considering Cologuard as an option.  He prefers to proceed with colorectal cancer screening at a later date once his upper gastrointestinal complaints are under better control.

## 2021-03-31 NOTE — Patient Instructions (Signed)
We have sent the following medications to your pharmacy for you to pick up at your convenience: famotidine 40 mg at bedtime.  You can take over the counter Gas-x or Gaviscon as needed for gas and bloating.   You have been scheduled for an endoscopy. Please follow written instructions given to you at your visit today. If you use inhalers (even only as needed), please bring them with you on the day of your procedure.  Due to recent changes in healthcare laws, you may see the results of your imaging and laboratory studies on MyChart before your provider has had a chance to review them.  We understand that in some cases there may be results that are confusing or concerning to you. Not all laboratory results come back in the same time frame and the provider may be waiting for multiple results in order to interpret others.  Please give [place] 48 hours in order for your provider to thoroughly review all the results before contacting the office for clarification of your results.   The [facility] providers would like to encourage you to use [facility] to communicate with providers for non-urgent requests or questions.  Due to long hold times on the telephone, sending your provider a message by [facility] may be a faster and more efficient way to get a response.  Please allow 48 business hours for a response.  Please remember that this is for non-urgent requests.   Thank you for choosing me and [facility].  Venita Lick. Pleas Koch., MD., Clementeen Graham

## 2021-05-01 ENCOUNTER — Ambulatory Visit (AMBULATORY_SURGERY_CENTER): Payer: 59 | Admitting: Gastroenterology

## 2021-05-01 ENCOUNTER — Encounter: Payer: Self-pay | Admitting: Gastroenterology

## 2021-05-01 VITALS — BP 128/90 | HR 71 | Temp 98.8°F | Resp 21 | Ht 67.0 in | Wt 223.0 lb

## 2021-05-01 DIAGNOSIS — K222 Esophageal obstruction: Secondary | ICD-10-CM

## 2021-05-01 DIAGNOSIS — K2100 Gastro-esophageal reflux disease with esophagitis, without bleeding: Secondary | ICD-10-CM | POA: Diagnosis not present

## 2021-05-01 DIAGNOSIS — K449 Diaphragmatic hernia without obstruction or gangrene: Secondary | ICD-10-CM

## 2021-05-01 DIAGNOSIS — R1012 Left upper quadrant pain: Secondary | ICD-10-CM

## 2021-05-01 DIAGNOSIS — R1319 Other dysphagia: Secondary | ICD-10-CM | POA: Diagnosis not present

## 2021-05-01 MED ORDER — SODIUM CHLORIDE 0.9 % IV SOLN: 500.0000 mL | Freq: Once | INTRAVENOUS | Status: DC

## 2021-05-01 NOTE — Progress Notes (Signed)
Pt's states no medical or surgical changes since previsit or office visit.  VS CW  

## 2021-05-01 NOTE — Op Note (Signed)
[facility] Patient Name: Dustin Nichols Procedure Date: 05/01/2021 10:06 AM MRN: [id number] Endoscopist: Ladene Artist , MD Age: 51 Referring MD:  Date of Birth: 03-21-1970 Gender: Male Account #: [id number] Procedure:                Upper GI endoscopy Indications:              Abdominal pain in the left upper quadrant, Dysphagia Medicines:                Monitored Anesthesia Care Procedure:                Pre-Anesthesia Assessment:                           - Prior to the procedure, a History and Physical                            was performed, and patient medications and                            allergies were reviewed. The patient's tolerance of                            previous anesthesia was also reviewed. The risks                            and benefits of the procedure and the sedation                            options and risks were discussed with the patient.                            All questions were answered, and informed consent                            was obtained. Prior Anticoagulants: The patient has                            taken no previous anticoagulant or antiplatelet                            agents. ASA Grade Assessment: II - A patient with                            mild systemic disease. After reviewing the risks                            and benefits, the patient was deemed in                            satisfactory condition to undergo the procedure.                           After obtaining informed consent, the endoscope was  passed under direct vision. Throughout the                            procedure, the patient's blood pressure, pulse, and                            oxygen saturations were monitored continuously. The                            GIF [id number] was introduced through the                            mouth, and advanced to the second part of duodenum.                            The  upper GI endoscopy was accomplished without                            difficulty. The patient tolerated the procedure                            well. Scope In: Scope Out: Findings:                 LA Grade B (one or more mucosal breaks greater than                            5 mm, not extending between the tops of two mucosal                            folds) esophagitis with no bleeding was found at                            the gastroesophageal junction.                           Two benign-appearing, intrinsic moderate stenoses                            were found in the distal esophagus. The narrowest                            stenosis measured 1.2 cm (inner diameter) x less                            than one cm (in length). The stenoses were                            traversed. A guidewire was placed and the scope was                            withdrawn. Dilations were performed with Savary  dilators with mild resistance at 14 mm, 15 mm and                            16 mm. No heme on dilators.                           The exam of the esophagus was otherwise normal.                           A small hiatal hernia was present.                           The exam of the stomach was otherwise normal.                           The duodenal bulb and second portion of the                            duodenum were normal. Complications:            No immediate complications. Estimated Blood Loss:     Estimated blood loss was minimal. Impression:               - LA Grade B reflux esophagitis with no bleeding.                           - Benign-appearing esophageal stenoses. Dilated.                           - Small hiatal hernia.                           - Normal duodenal bulb and second portion of the                            duodenum.                           - No specimens collected. Recommendation:           - Patient has a contact number available  for                            emergencies. The signs and symptoms of potential                            delayed complications were discussed with the                            patient. Return to normal activities tomorrow.                            Written discharge instructions were provided to the                            patient.                           -  Clear liquid diet fro 2 hours, then advance as                            tolerated to soft diet today.                           - Resume prior diet tomorrow.                           - Follow antireflux measures long term.                           - Continue present medications.                           - Return to GI office in 3 months. Ladene Artist, MD 05/01/2021 10:30:24 AM This report has been signed electronically.

## 2021-05-01 NOTE — Progress Notes (Signed)
See 03/31/2021 H&P, no changes.

## 2021-05-01 NOTE — Progress Notes (Signed)
Vss nad trans tp pacu 

## 2021-05-01 NOTE — Patient Instructions (Signed)
Follow post dilation diet handout today. Resume previous diet tomorrow. Follow anti-reflux measures long term. Continue present medications. Return to GI office in 3 months.  YOU HAD AN ENDOSCOPIC PROCEDURE TODAY AT THE [facility]:   Refer to the procedure report that was given to you for any specific questions about what was found during the examination.  If the procedure report does not answer your questions, please call your gastroenterologist to clarify.  If you requested that your care partner not be given the details of your procedure findings, then the procedure report has been included in a sealed envelope for you to review at your convenience later.  YOU SHOULD EXPECT: Some feelings of bloating in the abdomen. Passage of more gas than usual.  Walking can help get rid of the air that was put into your GI tract during the procedure and reduce the bloating. If you had a lower endoscopy (such as a colonoscopy or flexible sigmoidoscopy) you may notice spotting of blood in your stool or on the toilet paper. If you underwent a bowel prep for your procedure, you may not have a normal bowel movement for a few days.  Please Note:  You might notice some irritation and congestion in your nose or some drainage.  This is from the oxygen used during your procedure.  There is no need for concern and it should clear up in a day or so.  SYMPTOMS TO REPORT IMMEDIATELY:  Following upper endoscopy (EGD)  Vomiting of blood or coffee ground material  New chest pain or pain under the shoulder blades  Painful or persistently difficult swallowing  New shortness of breath  Fever of 100F or higher  Black, tarry-looking stools  For urgent or emergent issues, a gastroenterologist can be reached at any hour by calling [phone]. Do not use MyChart messaging for urgent concerns.    DIET:  We do recommend a small meal at first, but then you may proceed to your regular diet.  Drink plenty of fluids  but you should avoid alcoholic beverages for 24 hours.  ACTIVITY:  You should plan to take it easy for the rest of today and you should NOT DRIVE or use heavy machinery until tomorrow (because of the sedation medicines used during the test).    FOLLOW UP: Our staff will call the number listed on your records 48-72 hours following your procedure to check on you and address any questions or concerns that you may have regarding the information given to you following your procedure. If we do not reach you, we will leave a message.  We will attempt to reach you two times.  During this call, we will ask if you have developed any symptoms of COVID 19. If you develop any symptoms (ie: fever, flu-like symptoms, shortness of breath, cough etc.) before then, please call [phone].  If you test positive for Covid 19 in the 2 weeks post procedure, please call and report this information to [place].    If any biopsies were taken you will be contacted by phone or by letter within the next 1-3 weeks.  Please call us at [phone] if you have not heard about the biopsies in 3 weeks.    SIGNATURES/CONFIDENTIALITY: You and/or your care partner have signed paperwork which will be entered into your electronic medical record.  These signatures attest to the fact that that the information above on your After Visit Summary has been reviewed and is understood.  Full responsibility of the  confidentiality of this discharge information lies with you and/or your care-partner.

## 2021-05-01 NOTE — Progress Notes (Signed)
Called to room to assist during endoscopic procedure.  Patient ID and intended procedure confirmed with present staff. Received instructions for my participation in the procedure from the performing physician.  

## 2021-05-06 ENCOUNTER — Telehealth: Payer: Self-pay | Admitting: *Deleted

## 2021-05-06 ENCOUNTER — Telehealth: Payer: Self-pay

## 2021-05-06 NOTE — Telephone Encounter (Signed)
°  Follow up Call-  Call back number 05/01/2021  Post procedure Call Back phone  # [phone]  Permission to leave phone message Yes  Some recent data might be hidden     Patient questions:  Do you have a fever, pain , or abdominal swelling? No. Pain Score  0 *  Have you tolerated food without any problems? Yes.    Have you been able to return to your normal activities? Yes.    Do you have any questions about your discharge instructions: Diet   No. Medications  No. Follow up visit  No.  Do you have questions or concerns about your Care? No.  Actions: * If pain score is 4 or above: No action needed, pain <4.

## 2021-05-06 NOTE — Telephone Encounter (Signed)
Left message on follow up call. 

## 2021-06-19 ENCOUNTER — Ambulatory Visit: Payer: 59 | Admitting: Allergy and Immunology

## 2021-07-02 ENCOUNTER — Ambulatory Visit: Payer: 59 | Admitting: Allergy and Immunology

## 2021-07-02 ENCOUNTER — Encounter: Payer: Self-pay | Admitting: Allergy and Immunology

## 2021-07-02 VITALS — BP 166/108 | HR 79 | Resp 16

## 2021-07-02 DIAGNOSIS — K2100 Gastro-esophageal reflux disease with esophagitis, without bleeding: Secondary | ICD-10-CM | POA: Diagnosis not present

## 2021-07-02 DIAGNOSIS — J4540 Moderate persistent asthma, uncomplicated: Secondary | ICD-10-CM

## 2021-07-02 DIAGNOSIS — K200 Eosinophilic esophagitis: Secondary | ICD-10-CM | POA: Diagnosis not present

## 2021-07-02 DIAGNOSIS — J3089 Other allergic rhinitis: Secondary | ICD-10-CM

## 2021-07-02 DIAGNOSIS — J301 Allergic rhinitis due to pollen: Secondary | ICD-10-CM

## 2021-07-02 NOTE — Progress Notes (Signed)
? ?[place] - [employer] [facility] [place] - [place] - [place] ? ? ?Follow-up Note ? ?Referring Provider: No ref. provider found ?Primary Provider: Patient, No Pcp Per (Inactive) ?Date of Office Visit: 07/02/2021 ? ?Subjective:  ? ?Dustin Nichols (DOB: 1970-10-09) is a 51 y.o. male who [facility] in re-evaluation of the following: ? ?HPI: Dustin Nichols returns to this clinic in evaluation of asthma, allergic rhinoconjunctivitis, atopic dermatitis, and eosinophilic esophagitis.  His last visit to this clinic was 27 March 2021. ? ?His airway issue is really under very good control at this point in time while using Symbicort mostly just 1 time per day and some occasional nasal steroid as well as dupilumab injections.  He has not required a systemic steroid or antibiotic for any type of airway issue and he rarely uses a short acting bronchodilator and can exert himself without any problem. ? ?As well, his eosinophilic esophagitis is under good control at this point in time while utilizing dupilumab 300 mg every week and pantoprazole and famotidine.  He underwent a recent esophageal dilation with a good result and now he can eat most foods without any obstruction. ? ?His atopic dermatitis is under excellent control and he rarely uses any topical mometasone at this point. ? ?Allergies as of 07/02/2021   ? ?   Reactions  ? Amoxicillin   ? REACTION: Rash  ? ?  ? ?  ?Medication List  ? ? ?albuterol 108 (90 Base) MCG/ACT inhaler ?Commonly known as: VENTOLIN HFA ?Inhale 2 puffs into the lungs every 6 (six) hours as needed for wheezing or shortness of breath. ?  ?aspirin EC 81 MG tablet ?Take 81 mg by mouth daily. Swallow whole. ?  ?budesonide-formoterol 160-4.5 MCG/ACT inhaler ?Commonly known as: Symbicort ?Inhale two puffs one to two times daily to prevent cough or wheeze.  Rinse, gargle, and spit after use. ?  ?cetirizine 10 MG tablet ?Commonly known as: ZYRTEC ?Take 10 mg by mouth  daily. ?  ?dicyclomine 10 MG capsule ?Commonly known as: BENTYL ?Take 1 capsule (10 mg total) by mouth 3 (three) times daily before meals. ?  ?Dupixent 300 MG/2ML prefilled syringe ?Generic drug: dupilumab ?Inject 300 mg into the skin every 7 (seven) days. ?  ?famotidine 40 MG tablet ?Commonly known as: PEPCID ?Take 1 tablet (40 mg total) by mouth at bedtime. ?  ?mometasone 0.1 % cream ?Commonly known as: ELOCON ?Can apply to eczema once daily if needed. ?  ?MULTIVITAMIN PO ?Take by mouth daily. ?  ?pantoprazole 40 MG tablet ?Commonly known as: PROTONIX ?Take 1 tablet (40 mg total) by mouth 2 (two) times daily before a meal. ?  ? ?Past Medical History:  ?Diagnosis Date  ? Asthma   ? Depression   ? Eosinophilic esophagitis   ? GERD (gastroesophageal reflux disease)   ? Hiatal hernia   ? Hypertension   ? Peptic stricture of esophagus   ? ? ?Past Surgical History:  ?Procedure Laterality Date  ? SURGERY SCROTAL / TESTICULAR    ? blockage  ? UPPER GASTROINTESTINAL ENDOSCOPY    ? ? ?Review of systems negative except as noted in HPI / PMHx or noted below: ? ?Review of Systems  ?Constitutional: Negative.   ?HENT: Negative.    ?Eyes: Negative.   ?Respiratory: Negative.    ?Cardiovascular: Negative.   ?Gastrointestinal: Negative.   ?Genitourinary: Negative.   ?Musculoskeletal: Negative.   ?Skin: Negative.   ?Neurological: Negative.   ?Endo/Heme/Allergies: Negative.   ?  Psychiatric/Behavioral: Negative.    ? ? ?Objective:  ? ?Vitals:  ? 07/02/21 1614 07/02/21 1631  ?BP: (!) 160/96 (!) 166/108  ?Pulse: 79   ?Resp: 16   ?SpO2: 95%   ? ?   ?   ? ?Physical Exam ?Constitutional:   ?   Appearance: He is not diaphoretic.  ?HENT:  ?   Head: Normocephalic.  ?   Right Ear: Tympanic membrane, ear canal and external ear normal.  ?   Left Ear: Tympanic membrane, ear canal and external ear normal.  ?   Nose: Nose normal. No mucosal edema or rhinorrhea.  ?   Mouth/Throat:  ?   Pharynx: Uvula midline. No oropharyngeal exudate.  ?Eyes:  ?    Conjunctiva/sclera: Conjunctivae normal.  ?Neck:  ?   Thyroid: No thyromegaly.  ?   Trachea: Trachea normal. No tracheal tenderness or tracheal deviation.  ?Cardiovascular:  ?   Rate and Rhythm: Normal rate and regular rhythm.  ?   Heart sounds: Normal heart sounds, S1 normal and S2 normal. No murmur heard. ?Pulmonary:  ?   Effort: No respiratory distress.  ?   Breath sounds: Normal breath sounds. No stridor. No wheezing or rales.  ?Lymphadenopathy:  ?   Head:  ?   Right side of head: No tonsillar adenopathy.  ?   Left side of head: No tonsillar adenopathy.  ?   Cervical: No cervical adenopathy.  ?Skin: ?   Findings: No erythema or rash.  ?   Nails: There is no clubbing.  ?Neurological:  ?   Mental Status: He is alert.  ? ? ?Diagnostics: none ? ?Assessment and Plan:  ? ?1. Asthma, moderate persistent, well-controlled   ?2. Perennial allergic rhinitis   ?3. Seasonal allergic rhinitis due to pollen   ?4. Gastroesophageal reflux disease with esophagitis without hemorrhage   ?5. Eosinophilic esophagitis   ? ?1.  Allergen avoidance measures - Dust mite, cat, dog, pollens ? ?2.  Treat and prevent inflammation: ? ? A. Dupilumab 300 mg every week  ?B. Symbicort 160 - 2 inhalations 1-2 times per day ? C. OTC Nasal Steroid - 1-2 sprays each nostril 1-7 times per week ? ?3.  Treat reflux: ? ?A. Pantoprazole 40 mg - 1 tablet twice a day ?B. Famotidine 40 mg - 1 tablet in evening ?C. Minimize caffeine consumption ? ?4. If needed: ? ? A. Albuterol HFA - 2 inhalations every 4-6 hours ? B. OTC antihistamine - Claritin / Zrytec 10 mg - 1 tablet 1 time per day ? C. Mometasone 0.1% cream applied to eczema 1 time per day ? ?5. Return to clinic in 6 months or earlier if problem ? ?Dustin Nichols appears to be doing relatively well at this point in time while using dupilumab and some occasional inhaled and nasal steroids and aggressive therapy directed against his reflux and eosinophilic esophagitis as noted above.  We will keep him on his  anti-IL-4/13 biologic agent along with his other therapy and assume he will continue to do well on this plan and see him back in this clinic in 6 months or earlier if there is a problem. ? ?Laurette Schimke, MD ?Allergy / Immunology ?[facility] ?

## 2021-07-02 NOTE — Patient Instructions (Addendum)
?  1.  Allergen avoidance measures - Dust mite, cat, dog, pollens ? ?2.  Treat and prevent inflammation: ? ? A. Dupilumab 300 mg every week  ?B. Symbicort 160 - 2 inhalations 1-2 times per day ? C. OTC Nasal Steroid - 1-2 sprays each nostril 1-7 times per week ? ?3.  Treat reflux: ? ?A. Pantoprazole 40 mg - 1 tablet twice a day ?B. Famotidine 40 mg - 1 tablet in evening ?C. Minimize caffeine consumption ? ?4. If needed: ? ? A. Albuterol HFA - 2 inhalations every 4-6 hours ? B. OTC antihistamine - Claritin / Zrytec 10 mg - 1 tablet 1 time per day ? C. Mometasone 0.1% cream applied to eczema 1 time per day ? ?5. Return to clinic in 6 months or earlier if problem ? ? ? ? ?

## 2021-07-03 ENCOUNTER — Encounter: Payer: Self-pay | Admitting: Allergy and Immunology

## 2021-07-07 ENCOUNTER — Other Ambulatory Visit: Payer: Self-pay | Admitting: *Deleted

## 2021-07-07 MED ORDER — BUDESONIDE-FORMOTEROL FUMARATE 160-4.5 MCG/ACT IN AERO: INHALATION_SPRAY | RESPIRATORY_TRACT | 5 refills | Status: DC

## 2021-10-10 ENCOUNTER — Encounter: Payer: Self-pay | Admitting: Gastroenterology

## 2021-11-07 ENCOUNTER — Encounter: Payer: Self-pay | Admitting: Nurse Practitioner

## 2021-11-07 ENCOUNTER — Other Ambulatory Visit (INDEPENDENT_AMBULATORY_CARE_PROVIDER_SITE_OTHER): Payer: 59

## 2021-11-07 ENCOUNTER — Ambulatory Visit (INDEPENDENT_AMBULATORY_CARE_PROVIDER_SITE_OTHER): Payer: 59 | Admitting: Nurse Practitioner

## 2021-11-07 VITALS — BP 162/104 | HR 100 | Ht 67.0 in | Wt 223.0 lb

## 2021-11-07 DIAGNOSIS — R7989 Other specified abnormal findings of blood chemistry: Secondary | ICD-10-CM | POA: Diagnosis not present

## 2021-11-07 DIAGNOSIS — R1011 Right upper quadrant pain: Secondary | ICD-10-CM | POA: Diagnosis not present

## 2021-11-07 LAB — HEPATIC FUNCTION PANEL
ALT: 23 U/L (ref 0–53)
AST: 20 U/L (ref 0–37)
Albumin: 4.4 g/dL (ref 3.5–5.2)
Alkaline Phosphatase: 95 U/L (ref 39–117)
Bilirubin, Direct: 0.2 mg/dL (ref 0.0–0.3)
Total Bilirubin: 0.7 mg/dL (ref 0.2–1.2)
Total Protein: 7.8 g/dL (ref 6.0–8.3)

## 2021-11-07 NOTE — Patient Instructions (Signed)
Please purchase the following medications over the counter and take as directed:  Salon Pas patches as directed on box x 5 days Ibuprofen 600 mg twice daily for 3 days  ________________________________________________  Your provider has requested that you go to the basement level for lab work before leaving today. Press "B" on the elevator. The lab is located at the first door on the left as you exit the elevator. ________________________________________________ Dustin Nichols have been scheduled for an RUQ abdominal ultrasound at [facility] (1st floor of hospital) on Wednesday 11/12/21 at 8:00 am. Please arrive 30 minutes prior to your appointment for registration. Make certain not to have anything to eat or drink 6 hours prior to your appointment. Should you need to reschedule your appointment, please contact radiology at [phone]. This test typically takes about 30 minutes to perform. ___________________________________________________  If you are age 52 or older, your body mass index should be between 23-30. Your Body mass index is 34.93 kg/m. If this is out of the aforementioned range listed, please consider follow up with your Primary Care Provider.  If you are age 32 or younger, your body mass index should be between 19-25. Your Body mass index is 34.93 kg/m. If this is out of the aformentioned range listed, please consider follow up with your Primary Care Provider.   ___________________________________________________  The [facility] providers would like to encourage you to use [name] to communicate with providers for non-urgent requests or questions.  Due to long hold times on the telephone, sending your provider a message by [facility] may be a faster and more efficient way to get a response.  Please allow 48 business hours for a response.  Please remember that this is for non-urgent requests.  ___________________________________________________ Due to recent changes in healthcare  laws, you may see the results of your imaging and laboratory studies on MyChart before your provider has had a chance to review them.  We understand that in some cases there may be results that are confusing or concerning to you. Not all laboratory results come back in the same time frame and the provider may be waiting for multiple results in order to interpret others.  Please give [place] 48 hours in order for your provider to thoroughly review all the results before contacting the office for clarification of your results.

## 2021-11-07 NOTE — Progress Notes (Signed)
Chief Complaint:  right sided abdominal pain    Assessment &  Plan   #51 year old male here with concerns about right-sided abdominal pain.  This sounds more musculoskeletal.  He has localized tenderness of right lower anterior ribcage and lower right back rib cage but gallbladder pathology should be excluded. .  Obtain RUQ US Obtain LFTs Salon Pas patches to affected areas as directed on package box x 5 days Ibuprofen 600 mg twice daily for 3 days Continue pantoprazole 40 mg BID and Pepcid at night Will contact him with ultrasound results at which time he can give [place] a condition update.   HPI   Dustin Nichols is a 51 y.o. male known to Dr. Russella Dar with a past medical history significant for GERD, eosinophilic esophagitis, esophageal stricture, asthma. See PMH /PSH for additional history   Jan 2023 office visit Having dysphagia,  intermittent epigastric pain, LUQ pain, left chest pain, gurgling sensation in epigastrium. Bentyl didn't help his symptoms.  Continued pantoprazole 40 mg p.o. twice daily s and famotidine 40 mg taken at bedtime.  Continue Dupixent. Begin Gas-X or Gaviscon 4 times daily as needed.  Schedule EGD with possible dilation and possible biopsies.  Feb 2023 EGD - LA Grade B reflux esophagitis with no bleeding. - Benign-appearing esophageal stenoses. Dilated. - Small hiatal hernia. - Normal duodenal bulb and second portion of the duodenum. - No specimens collected.  Interval History:  Last week he developed RUQ pain. Pain worse when bending over. Eating doesn't consistently make the pain worse. He does a lot of lifting at work. He thought initially that pain was a pulled muscle but now pain involving right flank. Pain waking him up at night. Ibuprofen does seen to help. No urinary symptoms. No nausea / vomiting. Bowel movements are at baseline. He is taking Bentyl TID,  pantoprazole BID and pepcid at bedtime   Past Medical History:  Diagnosis Date   Asthma     Depression    Eosinophilic esophagitis    GERD (gastroesophageal reflux disease)    Hiatal hernia    Hypertension    Peptic stricture of esophagus     Past Surgical History:  Procedure Laterality Date   SURGERY SCROTAL / TESTICULAR     blockage   UPPER GASTROINTESTINAL ENDOSCOPY      Current Medications, Allergies, Family History and Social History were reviewed in [employer] medical record.     Current Outpatient Medications  Medication Sig Dispense Refill   albuterol (VENTOLIN HFA) 108 (90 Base) MCG/ACT inhaler Inhale 2 puffs into the lungs every 6 (six) hours as needed for wheezing or shortness of breath. 18 g 3   aspirin EC 81 MG tablet Take 81 mg by mouth daily. Swallow whole.     budesonide-formoterol (SYMBICORT) 160-4.5 MCG/ACT inhaler Inhale two puffs one to two times daily to prevent cough or wheeze.  Rinse, gargle, and spit after use. 10.2 g 5   cetirizine (ZYRTEC) 10 MG tablet Take 10 mg by mouth daily.     dicyclomine (BENTYL) 10 MG capsule Take 1 capsule (10 mg total) by mouth 3 (three) times daily before meals. 90 capsule 11   dupilumab (DUPIXENT) 300 MG/2ML prefilled syringe Inject 300 mg into the skin every 7 (seven) days. 8 mL 11   famotidine (PEPCID) 40 MG tablet Take 1 tablet (40 mg total) by mouth at bedtime. 30 tablet 11   mometasone (ELOCON) 0.1 % cream Can apply to eczema once daily if needed.  100 g 5   Multiple Vitamin (MULTIVITAMIN PO) Take by mouth daily.     pantoprazole (PROTONIX) 40 MG tablet Take 1 tablet (40 mg total) by mouth 2 (two) times daily before a meal. 60 tablet 11   No current facility-administered medications for this visit.    Review of Systems: No chest pain. No shortness of breath. No urinary complaints.    Physical Exam  Wt Readings from Last 3 Encounters:  11/07/21 223 lb (101.2 kg)  05/01/21 223 lb (101.2 kg)  03/31/21 223 lb 8 oz (101.4 kg)    BP (!) 162/104   Pulse 100   Ht 5\' 7"  (1.702 m)   Wt 223 lb  (101.2 kg)   BMI 34.93 kg/m  Constitutional:  Generally well appearing male in no acute distress. Psychiatric: Pleasant. Normal mood and affect. Behavior is normal. EENT: Pupils normal.  Conjunctivae are normal. No scleral icterus. Neck supple.  Cardiovascular: Normal rate, regular rhythm. No edema Pulmonary/chest: Effort normal and breath sounds normal. No wheezing, rales or rhonchi. Abdominal: Soft, nondistended, nontender. Bowel sounds active throughout. There are no masses palpable. No hepatomegaly. Musculoskeletal. Localized tenderness to lower right rib cage and also right posterior rib cage. No CVA tenderness Neurological: Alert and oriented to person place and time. Skin: Skin is warm and dry. No rashes noted.  , NP  11/07/2021, 2:00 PM

## 2021-11-10 ENCOUNTER — Telehealth: Payer: Self-pay

## 2021-11-10 NOTE — Telephone Encounter (Signed)
Pt called to report he took ibuprofen 600 mg for 3 days and RUQ pain has not eased up. Pt states that RUQ abd pain ranges from a 6-7 at times to a 10 when he is trying to sleep. Pt described pain as sharp and pulsating and states that he feels like pain may move to chest at times but pt stated he wasn't sure if this was related to RUQ pain or his acid reflux. RUQ pain is not worse or better after eating and pt reports he is able to eat and drink normally. Pt states that movement seems to make pain worse. Pt has also been using Salon Pas patches but reports he still has the RUQ pain.  Pt also requested a refills on famotidine and bentyl to be sent to [facility] in [place].

## 2021-11-11 ENCOUNTER — Other Ambulatory Visit: Payer: Self-pay

## 2021-11-11 MED ORDER — FAMOTIDINE 40 MG PO TABS
40.0000 mg | ORAL_TABLET | Freq: Every day | ORAL | 3 refills | Status: DC
Start: 2021-11-11 — End: 2022-05-07

## 2021-11-11 MED ORDER — DICYCLOMINE HCL 10 MG PO CAPS: 10.0000 mg | ORAL_CAPSULE | Freq: Three times a day (TID) | ORAL | 3 refills | Status: DC

## 2021-11-11 NOTE — Telephone Encounter (Signed)
Left message for pt to call back. Refills placed for famotidine and bentyl.

## 2021-11-11 NOTE — Telephone Encounter (Signed)
Pt returned call. Let pt know that refills were sent to his pharmacy for pepcid and bentyl and gave pt Paula's message. Pt stated he did not want to proceed with EGD right now and was wondering if there was another medication that could be sent in for his pain. Let pt know that if he feels like the Salon Pas Patches, ibuprofen, and bentyl will not help his pain he could always follow up with a PCP. Pt verbalized understanding and had no other concerns at end of call.

## 2021-11-12 ENCOUNTER — Ambulatory Visit (HOSPITAL_COMMUNITY)
Admission: RE | Admit: 2021-11-12 | Discharge: 2021-11-12 | Disposition: A | Discharge status: Home / Self Care | Payer: 59 | Source: Ambulatory Visit | Attending: Nurse Practitioner | Admitting: Nurse Practitioner

## 2021-11-12 DIAGNOSIS — R1011 Right upper quadrant pain: Secondary | ICD-10-CM | POA: Diagnosis present

## 2022-01-01 ENCOUNTER — Ambulatory Visit (INDEPENDENT_AMBULATORY_CARE_PROVIDER_SITE_OTHER): Payer: 59 | Admitting: Allergy and Immunology

## 2022-01-01 VITALS — BP 150/98 | HR 90 | Temp 98.6°F | Resp 20 | Ht 67.5 in | Wt 220.0 lb

## 2022-01-01 DIAGNOSIS — J4540 Moderate persistent asthma, uncomplicated: Secondary | ICD-10-CM

## 2022-01-01 DIAGNOSIS — K200 Eosinophilic esophagitis: Secondary | ICD-10-CM | POA: Diagnosis not present

## 2022-01-01 DIAGNOSIS — L2089 Other atopic dermatitis: Secondary | ICD-10-CM

## 2022-01-01 DIAGNOSIS — K2100 Gastro-esophageal reflux disease with esophagitis, without bleeding: Secondary | ICD-10-CM

## 2022-01-01 DIAGNOSIS — J3089 Other allergic rhinitis: Secondary | ICD-10-CM

## 2022-01-01 DIAGNOSIS — J301 Allergic rhinitis due to pollen: Secondary | ICD-10-CM

## 2022-01-01 MED ORDER — AIRSUPRA 90-80 MCG/ACT IN AERO: 2.0000 | INHALATION_SPRAY | RESPIRATORY_TRACT | 1 refills | Status: DC | PRN

## 2022-01-01 MED ORDER — ALBUTEROL SULFATE HFA 108 (90 BASE) MCG/ACT IN AERS: 2.0000 | INHALATION_SPRAY | Freq: Four times a day (QID) | RESPIRATORY_TRACT | 1 refills | Status: DC | PRN

## 2022-01-01 NOTE — Patient Instructions (Signed)
  1.  Allergen avoidance measures - Dust mite, cat, dog, pollens  2.  Treat and prevent inflammation:   A. Restart Dupilumab 300 mg every week  B. Symbicort 160 - 2 inhalations 1-2 times per day  C. OTC Nasal Steroid - 1-2 sprays each nostril 1-7 times per week  3.  Treat reflux:  A. Pantoprazole 40 mg - 1 tablet twice a day B. Famotidine 40 mg - 1 tablet in evening C. Minimize caffeine consumption  4. If needed:   A. Airsupra - 2 inhalations every 4-6 hours (Coupon)  B. OTC antihistamine - Claritin / Zrytec 10 mg - 1 tablet 1 time per day  C. Mometasone 0.1% cream applied to eczema 1 time per day  5. Return to clinic in 6 months or earlier if problem  6. Obtain flu vaccine

## 2022-01-01 NOTE — Progress Notes (Signed)
[place] - [name] - [place]   Follow-up Note  Referring Provider: No ref. provider found Primary Provider: Patient, No Pcp Per Date of Office Visit: 01/01/2022  Subjective:   Dustin Nichols (DOB: 1970/12/29) is a 51 y.o. male who [facility] in re-evaluation of the following:  HPI: Devondre returns to this clinic in reevaluation of asthma, allergic rhinoconjunctivitis, atopic dermatitis, and eosinophilic esophagitis treated with dupilumab.  I last saw him in this clinic on 02 July 2021.  Because of a logistical issue he has missed his last 4 doses of dupilumab.  He has noticed that he had a little bit more problems with his asthma and has had to use his bronchodilator.  When using dupilumab he has no need to use a short acting bronchodilator.  He has noticed that he has had a little bit more problem with his skin involving his forearm and has had to use his topical agents more often.  He has been doing about the same with his upper airway issues.  He has been doing about the same with his GI issues.  Allergies as of 01/01/2022       Reactions   Amoxicillin    REACTION: Rash        Medication List    albuterol 108 (90 Base) MCG/ACT inhaler Commonly known as: VENTOLIN HFA Inhale 2 puffs into the lungs every 6 (six) hours as needed for wheezing or shortness of breath.   aspirin EC 81 MG tablet Take 81 mg by mouth daily. Swallow whole.   budesonide-formoterol 160-4.5 MCG/ACT inhaler Commonly known as: Symbicort Inhale two puffs one to two times daily to prevent cough or wheeze.  Rinse, gargle, and spit after use.   cetirizine 10 MG tablet Commonly known as: ZYRTEC Take 10 mg by mouth daily.   dicyclomine 10 MG capsule Commonly known as: BENTYL Take 1 capsule (10 mg total) by mouth 3 (three) times daily before meals.   Dupixent 300 MG/2ML prefilled syringe Generic drug: dupilumab Inject  300 mg into the skin every 7 (seven) days.   famotidine 40 MG tablet Commonly known as: PEPCID Take 1 tablet (40 mg total) by mouth at bedtime.   mometasone 0.1 % cream Commonly known as: ELOCON Can apply to eczema once daily if needed.   MULTIVITAMIN PO Take by mouth daily.   pantoprazole 40 MG tablet Commonly known as: PROTONIX Take 1 tablet (40 mg total) by mouth 2 (two) times daily before a meal.    Past Medical History:  Diagnosis Date   Asthma    Depression    Eosinophilic esophagitis    GERD (gastroesophageal reflux disease)    Hiatal hernia    Hypertension    Peptic stricture of esophagus     Past Surgical History:  Procedure Laterality Date   SURGERY SCROTAL / TESTICULAR     blockage   UPPER GASTROINTESTINAL ENDOSCOPY      Review of systems negative except as noted in HPI / PMHx or noted below:  Review of Systems  Constitutional: Negative.   HENT: Negative.    Eyes: Negative.   Respiratory: Negative.    Cardiovascular: Negative.   Gastrointestinal: Negative.   Genitourinary: Negative.   Musculoskeletal: Negative.   Skin: Negative.   Neurological: Negative.   Endo/Heme/Allergies: Negative.   Psychiatric/Behavioral: Negative.       Objective:   Vitals:   01/01/22 1614  BP: (!) 150/98  Pulse: 90  Resp: 20  Temp: 98.6 F (37 C)  SpO2: 98%   Height: 5' 7.5" (171.5 cm)  Weight: 220 lb (99.8 kg)   Physical Exam Constitutional:      Appearance: He is not diaphoretic.  HENT:     Head: Normocephalic.     Right Ear: Tympanic membrane, ear canal and external ear normal.     Left Ear: Tympanic membrane, ear canal and external ear normal.     Nose: Nose normal. No mucosal edema or rhinorrhea.     Mouth/Throat:     Pharynx: Uvula midline. No oropharyngeal exudate.  Eyes:     Conjunctiva/sclera: Conjunctivae normal.  Neck:     Thyroid: No thyromegaly.     Trachea: Trachea normal. No tracheal tenderness or tracheal deviation.  Cardiovascular:      Rate and Rhythm: Normal rate and regular rhythm.     Heart sounds: Normal heart sounds, S1 normal and S2 normal. No murmur heard. Pulmonary:     Effort: No respiratory distress.     Breath sounds: Normal breath sounds. No stridor. No wheezing or rales.  Lymphadenopathy:     Head:     Right side of head: No tonsillar adenopathy.     Left side of head: No tonsillar adenopathy.     Cervical: No cervical adenopathy.  Skin:    Findings: No erythema or rash.     Nails: There is no clubbing.  Neurological:     Mental Status: He is alert.     Diagnostics:    Spirometry was performed and demonstrated an FEV1 of 3.62 at 100 % of predicted.   Assessment and Plan:   1. Asthma, moderate persistent, well-controlled   2. Perennial allergic rhinitis   3. Seasonal allergic rhinitis due to pollen   4. Eosinophilic esophagitis   5. Other atopic dermatitis   6. Gastroesophageal reflux disease with esophagitis without hemorrhage    1.  Allergen avoidance measures - Dust mite, cat, dog, pollens  2.  Treat and prevent inflammation:   A. Restart Dupilumab 300 mg every week  B. Symbicort 160 - 2 inhalations 1-2 times per day  C. OTC Nasal Steroid - 1-2 sprays each nostril 1-7 times per week  3.  Treat reflux:  A. Pantoprazole 40 mg - 1 tablet twice a day B. Famotidine 40 mg - 1 tablet in evening C. Minimize caffeine consumption  4. If needed:   A. Airsupra - 2 inhalations every 4-6 hours (Coupon)  B. OTC antihistamine - Claritin / Zrytec 10 mg - 1 tablet 1 time per day  C. Mometasone 0.1% cream applied to eczema 1 time per day  5. Return to clinic in 6 months or earlier if problem  6. Obtain flu vaccine  I think that Braedan needs to use dupilumab or he is going to develop significant eosinophilic infiltration of multiple organ systems including his respiratory tract and his GI tract without this anti-IL-4/IL-13 biologic agents.  He has a good understanding about his other  anti-inflammatory agents for his airway and a good understanding about therapy for reflux and understands about varying the dose of his medications depending on disease activity.  Assuming he does well with the plan noted above I will see him back in this clinic in 6 months or earlier if there is a problem.  Allena Katz, MD Allergy / Immunology [place]

## 2022-01-02 ENCOUNTER — Telehealth: Payer: Self-pay | Admitting: Allergy and Immunology

## 2022-01-02 NOTE — Telephone Encounter (Signed)
Pharmacist called stating they do not have the airsupra in stock - she reached out to their warehouse and they do not have it in stock. Pharmacist also [facility].   Pharmacist did say the rep did come in to speak to them in regards to the [place].   Is there an alternative that can be sent in?   Please advise

## 2022-01-05 ENCOUNTER — Encounter: Payer: Self-pay | Admitting: Allergy and Immunology

## 2022-01-20 ENCOUNTER — Telehealth: Payer: Self-pay

## 2022-01-20 ENCOUNTER — Other Ambulatory Visit: Payer: Self-pay | Admitting: *Deleted

## 2022-01-20 MED ORDER — AIRSUPRA 90-80 MCG/ACT IN AERO: 2.0000 | INHALATION_SPRAY | RESPIRATORY_TRACT | 1 refills | Status: DC | PRN

## 2022-01-20 NOTE — Telephone Encounter (Signed)
New rx sent to [facility]. They should be able to obtain Airsupra now.

## 2022-01-20 NOTE — Telephone Encounter (Signed)
PA required notification received from patients pharmacy in [facility] through [place] for [place].  PA has been submitted through [employer] as required per plan and is awaiting determination.  PA EOC ID: [id number]

## 2022-01-20 NOTE — Telephone Encounter (Signed)
PA has been DENIED by plan for Airsupra.   Denial letter has been attached in patients documents.

## 2022-04-17 ENCOUNTER — Telehealth: Payer: Self-pay | Admitting: Nurse Practitioner

## 2022-04-17 MED ORDER — PANTOPRAZOLE SODIUM 40 MG PO TBEC: 40.0000 mg | DELAYED_RELEASE_TABLET | Freq: Two times a day (BID) | ORAL | 5 refills | Status: DC

## 2022-04-17 NOTE — Telephone Encounter (Signed)
Inbound call from patient requesting refill for Protonix. Please advise.  

## 2022-04-17 NOTE — Telephone Encounter (Signed)
Prescription sent to patient's pharmacy.

## 2022-05-07 ENCOUNTER — Other Ambulatory Visit: Payer: Self-pay | Admitting: Nurse Practitioner

## 2022-07-02 ENCOUNTER — Ambulatory Visit: Payer: Managed Care, Other (non HMO) | Admitting: Allergy and Immunology

## 2022-07-02 ENCOUNTER — Encounter: Payer: Self-pay | Admitting: Allergy and Immunology

## 2022-07-02 VITALS — BP 136/82 | HR 101 | Resp 16

## 2022-07-02 DIAGNOSIS — J301 Allergic rhinitis due to pollen: Secondary | ICD-10-CM

## 2022-07-02 DIAGNOSIS — J4540 Moderate persistent asthma, uncomplicated: Secondary | ICD-10-CM | POA: Diagnosis not present

## 2022-07-02 DIAGNOSIS — L2089 Other atopic dermatitis: Secondary | ICD-10-CM

## 2022-07-02 DIAGNOSIS — K200 Eosinophilic esophagitis: Secondary | ICD-10-CM

## 2022-07-02 DIAGNOSIS — J3089 Other allergic rhinitis: Secondary | ICD-10-CM

## 2022-07-02 DIAGNOSIS — K2100 Gastro-esophageal reflux disease with esophagitis, without bleeding: Secondary | ICD-10-CM

## 2022-07-02 MED ORDER — RYALTRIS 665-25 MCG/ACT NA SUSP: NASAL | 5 refills | Status: AC

## 2022-07-02 MED ORDER — METHYLPREDNISOLONE ACETATE 80 MG/ML IJ SUSP
80.0000 mg | Freq: Once | INTRAMUSCULAR | Status: AC
  Administered 2022-07-02: 80 mg via INTRAMUSCULAR

## 2022-07-02 NOTE — Progress Notes (Signed)
[place] - [place] - [place] - [name] - [place]   Follow-up Note  Referring Provider: No ref. provider found Primary Provider: Patient, No Pcp Per Date of Office Visit: 07/02/2022  Subjective:   Dustin Nichols (DOB: November 02, 1970) is a 52 y.o. male who [facility] in re-evaluation of the following:  HPI: Dustin Nichols to this clinic in evaluation of asthma, allergic rhinoconjunctivitis, atopic dermatitis, eosinophilic esophagitis.  I last saw him in this clinic 01 January 2022.  He has once again missed his dupilumab because of a logistical issue and an insurance issue and since he has done so he has had a little bit more problems with asthma and has had lots of problems with "acid" and his skin is flaring a little bit.  When he uses dupilumab it really helps all those issues.  But dupilumab does not actually help his nasal issue very much.  He still has constant nasal congestion and stuffiness especially on the right side because of his deviated septum.    He has been consistently using his Symbicort and Nasacort and pantoprazole and famotidine yet still continues to have these problems.  He rarely uses any air supra at this point and he rarely uses any topical mometasone at this point.  Allergies as of 07/02/2022       Reactions   Amoxicillin    REACTION: Rash        Medication List    Airsupra 90-80 MCG/ACT Aero Generic drug: Albuterol-Budesonide Inhale 2 puffs into the lungs every 4 (four) hours as needed.   aspirin EC 81 MG tablet Take 81 mg by mouth daily. Swallow whole.   budesonide-formoterol 160-4.5 MCG/ACT inhaler Commonly known as: Symbicort Inhale two puffs one to two times daily to prevent cough or wheeze.  Rinse, gargle, and spit after use.   cetirizine 10 MG tablet Commonly known as: ZYRTEC Take 10 mg by mouth daily.   dicyclomine 10 MG capsule Commonly known as: BENTYL Take 1 capsule (10 mg total)  by mouth 3 (three) times daily before meals.   Dupixent 300 MG/2ML prefilled syringe Generic drug: dupilumab Inject 300 mg into the skin every 7 (seven) days.   famotidine 40 MG tablet Commonly known as: PEPCID TAKE 1 TABLET BY MOUTH AT BEDTIME   mometasone 0.1 % cream Commonly known as: ELOCON Can apply to eczema once daily if needed.   MULTIVITAMIN PO Take by mouth daily.   pantoprazole 40 MG tablet Commonly known as: PROTONIX Take 1 tablet (40 mg total) by mouth 2 (two) times daily before a meal.    Past Medical History:  Diagnosis Date   Asthma    Depression    Eosinophilic esophagitis    GERD (gastroesophageal reflux disease)    Hiatal hernia    Hypertension    Peptic stricture of esophagus     Past Surgical History:  Procedure Laterality Date   SURGERY SCROTAL / TESTICULAR     blockage   UPPER GASTROINTESTINAL ENDOSCOPY      Review of systems negative except as noted in HPI / PMHx or noted below:  Review of Systems  Constitutional: Negative.   HENT: Negative.    Eyes: Negative.   Respiratory: Negative.    Cardiovascular: Negative.   Gastrointestinal: Negative.   Genitourinary: Negative.   Musculoskeletal: Negative.   Skin: Negative.   Neurological: Negative.   Endo/Heme/Allergies: Negative.   Psychiatric/Behavioral: Negative.       Objective:  Vitals:   07/02/22 1646  BP: 136/82  Pulse: (!) 101  Resp: 16  SpO2: 98%          Physical Exam Constitutional:      Appearance: He is not diaphoretic.  HENT:     Head: Normocephalic.     Right Ear: Tympanic membrane, ear canal and external ear normal.     Left Ear: Tympanic membrane, ear canal and external ear normal.     Nose: Mucosal edema present. No rhinorrhea.     Mouth/Throat:     Pharynx: Uvula midline. No oropharyngeal exudate.  Eyes:     Conjunctiva/sclera: Conjunctivae normal.  Neck:     Thyroid: No thyromegaly.     Trachea: Trachea normal. No tracheal tenderness or tracheal  deviation.  Cardiovascular:     Rate and Rhythm: Normal rate and regular rhythm.     Heart sounds: Normal heart sounds, S1 normal and S2 normal. No murmur heard. Pulmonary:     Effort: No respiratory distress.     Breath sounds: No stridor. Wheezing (expiratory wheezes posterior lung fields) present. No rales.  Lymphadenopathy:     Head:     Right side of head: No tonsillar adenopathy.     Left side of head: No tonsillar adenopathy.     Cervical: No cervical adenopathy.  Skin:    Findings: No erythema or rash.     Nails: There is no clubbing.  Neurological:     Mental Status: He is alert.     Diagnostics:    Spirometry was performed and demonstrated an FEV1 of 3.04 at 84 % of predicted.  Assessment and Plan:   1. Not well controlled moderate persistent asthma   2. Perennial allergic rhinitis   3. Seasonal allergic rhinitis due to pollen   4. Other atopic dermatitis   5. Eosinophilic esophagitis   6. Gastroesophageal reflux disease with esophagitis without hemorrhage    1.  Allergen avoidance measures - Dust mite, cat, dog, pollens  2.  Treat and prevent inflammation:   A. Restart Dupilumab 300 mg every week  B. Symbicort 160 - 2 inhalations 1-2 times per day  C. RYALTRIS - 2 sprays each nostril 1-2 times per day (SP)  D. Depomedrol 80 IM delivered in clinic today  3.  Treat reflux:  A. Pantoprazole 40 mg - 1 tablet twice a day B. Famotidine 40 mg - 1 tablet in evening C. Minimize caffeine consumption  4. If needed:   A. Airsupra - 2 inhalations every 4-6 hours   B. OTC antihistamine - Claritin / Zyrtec 10 mg - 1 tablet 1 time per day  C. Mometasone 0.1% cream applied to eczema 1 time per day  5. Consider starting a course of immunotherapy  6. Repair of deviated septum???  7. Return to clinic in 6 months or earlier if problem  We we will treat Dustin Nichols with a systemic steroid today as he is flared up with his multiorgan atopic disease and I have given him a  different form of nasal steroid/antihistamine combination to use and if his nose still remains congested especially with a predilection for right-sided congested then he may need to have repair of a deviated septum to get an adequate airway opening.  He is very atopic and he would benefit from a course of immunotherapy.  He should definitely restart his dupilumab injections and work through the logistical block that appears to be present with his insurance company.  Further evaluation and treatment will  be based upon his response to therapy noted above.  Laurette Schimke, MD Allergy / Immunology [facility]

## 2022-07-02 NOTE — Patient Instructions (Addendum)
  1.  Allergen avoidance measures - Dust mite, cat, dog, pollens  2.  Treat and prevent inflammation:   A. Restart Dupilumab 300 mg every week  B. Symbicort 160 - 2 inhalations 1-2 times per day  C. RYALTRIS - 2 sprays each nostril 1-2 times per day (SP)  D. Depomedrol 80 IM delivered in clinic today  3.  Treat reflux:  A. Pantoprazole 40 mg - 1 tablet twice a day B. Famotidine 40 mg - 1 tablet in evening C. Minimize caffeine consumption  4. If needed:   A. Airsupra - 2 inhalations every 4-6 hours   B. OTC antihistamine - Claritin / Zyrtec 10 mg - 1 tablet 1 time per day  C. Mometasone 0.1% cream applied to eczema 1 time per day  5. Consider starting a course of immunotherapy  6. Repair of deviated septum???  7.  Return to clinic in 6 months or earlier if problem

## 2022-07-06 ENCOUNTER — Encounter: Payer: Self-pay | Admitting: Allergy and Immunology

## 2022-09-09 ENCOUNTER — Other Ambulatory Visit: Payer: Self-pay | Admitting: Nurse Practitioner

## 2022-09-21 ENCOUNTER — Other Ambulatory Visit: Payer: Self-pay | Admitting: Allergy and Immunology

## 2022-11-20 ENCOUNTER — Other Ambulatory Visit: Payer: Self-pay | Admitting: Nurse Practitioner

## 2022-12-29 ENCOUNTER — Other Ambulatory Visit: Payer: Self-pay | Admitting: Gastroenterology

## 2022-12-31 ENCOUNTER — Other Ambulatory Visit: Payer: Self-pay

## 2022-12-31 ENCOUNTER — Encounter: Payer: Self-pay | Admitting: Allergy and Immunology

## 2022-12-31 ENCOUNTER — Other Ambulatory Visit: Payer: Self-pay | Admitting: Gastroenterology

## 2022-12-31 ENCOUNTER — Ambulatory Visit (INDEPENDENT_AMBULATORY_CARE_PROVIDER_SITE_OTHER): Payer: Managed Care, Other (non HMO) | Admitting: Allergy and Immunology

## 2022-12-31 VITALS — BP 144/90 | HR 99 | Temp 98.4°F | Resp 18 | Ht 67.5 in | Wt 208.2 lb

## 2022-12-31 DIAGNOSIS — L2089 Other atopic dermatitis: Secondary | ICD-10-CM

## 2022-12-31 DIAGNOSIS — J301 Allergic rhinitis due to pollen: Secondary | ICD-10-CM | POA: Diagnosis not present

## 2022-12-31 DIAGNOSIS — K2100 Gastro-esophageal reflux disease with esophagitis, without bleeding: Secondary | ICD-10-CM

## 2022-12-31 DIAGNOSIS — J3089 Other allergic rhinitis: Secondary | ICD-10-CM | POA: Diagnosis not present

## 2022-12-31 DIAGNOSIS — J4540 Moderate persistent asthma, uncomplicated: Secondary | ICD-10-CM

## 2022-12-31 DIAGNOSIS — K200 Eosinophilic esophagitis: Secondary | ICD-10-CM

## 2022-12-31 MED ORDER — PANTOPRAZOLE SODIUM 40 MG PO TBEC: 40.0000 mg | DELAYED_RELEASE_TABLET | Freq: Two times a day (BID) | ORAL | 5 refills | Status: DC

## 2022-12-31 MED ORDER — MOMETASONE FUROATE 0.1 % EX CREA: TOPICAL_CREAM | CUTANEOUS | 5 refills | Status: AC

## 2022-12-31 MED ORDER — BUDESONIDE-FORMOTEROL FUMARATE 160-4.5 MCG/ACT IN AERO: INHALATION_SPRAY | RESPIRATORY_TRACT | 5 refills | Status: DC

## 2022-12-31 MED ORDER — FAMOTIDINE 40 MG PO TABS: 40.0000 mg | ORAL_TABLET | Freq: Every day | ORAL | 1 refills | Status: DC

## 2022-12-31 MED ORDER — AIRSUPRA 90-80 MCG/ACT IN AERO: 2.0000 | INHALATION_SPRAY | RESPIRATORY_TRACT | 1 refills | Status: DC | PRN

## 2022-12-31 NOTE — Patient Instructions (Signed)
  1.  Allergen avoidance measures - Dust mite, cat, dog, pollens  2.  Treat and prevent inflammation:   A. Symbicort 160 - 2 inhalations 1-2 times per day  B. Astepro - 2 sprays each nostril 1-2 times per day  C. Nasacort - 2 sprays each nostril 1-2 times per day  3.  Treat reflux and EOE:  A. Budesonide 1 gm / 1 teaspoon honey 1-2 times per day B. Pantoprazole 40 mg - 1 tablet twice a day B. Famotidine 40 mg - 1 tablet in evening C. Minimize caffeine consumption  4. If needed:   A. Airsupra - 2 inhalations every 4-6 hours   B. OTC antihistamine - Claritin / Zyrtec 10 mg - 1 tablet 1 time per day  C. Mometasone 0.1% cream applied to eczema 1 time per day  5.  Plan for fall flu vaccine  6.  Return to clinic in December 2024 or earlier if problem

## 2022-12-31 NOTE — Progress Notes (Signed)
[place] - [place] - [place] - [name] -    Follow-up Note  Referring Provider: No ref. provider found Primary Provider: Patient, No Pcp Per Date of Office Visit: 12/31/2022  Subjective:   Dustin Nichols (DOB: Aug 13, 1970) is a 52 y.o. male who [facility] 12[facility]/2022 in re-evaluation of the following:  HPI: Dustin Nichols returns to this clinic in evaluation of asthma, allergic rhinoconjunctivitis, atopic dermatitis, eosinophilic esophagitis.  I last saw him in this clinic 02 July 2022.  When I last saw him in this clinic I recommended that he restart his dupilumab which he has not done.  He has been having a lot of problems with his reflux.  He is having a lot of burning in his mid chest and he can actually feel things gurgling in that area.  He has been using his pantoprazole twice a day and famotidine at night.  He has minimized his caffeine consumption.  He thinks that he can swallow pretty well.  He has had some stuffiness and sneezing which has been a chronic problem.  He started using Astepro recently.  He has had some more issues with asthma.  He feels as though his breathing was definitely doing better on dupilumab.  He has been using his Symbicort only 1 time per day.  Has been using a short acting bronchodilator about 1 time per day.  His skin has not been bothering him and he has not had to use any topical steroid.  Allergies as of 12/31/2022       Reactions   Amoxicillin    REACTION: Rash        Medication List    Airsupra 90-80 MCG/ACT Aero Generic drug: Albuterol-Budesonide Inhale 2 puffs into the lungs every 4 (four) hours as needed.   aspirin EC 81 MG tablet Take 81 mg by mouth daily. Swallow whole.   [place] 160-4.5 MCG/ACT inhaler Generic drug: budesonide-formoterol INHALE 2 PUFFS BY MOUTH ONE TO TWO TIMES DAILY TO PREVENT COUGH OR WHEEZE. RINSE, GARGLE, AND SPIT AFTER USE   budesonide-formoterol 160-4.5  MCG/ACT inhaler Commonly known as: Symbicort 2 inhalations 1-2 times per day   cetirizine 10 MG tablet Commonly known as: ZYRTEC Take 10 mg by mouth daily.   dicyclomine 10 MG capsule Commonly known as: BENTYL TAKE 1 CAPSULE BY MOUTH THREE TIMES DAILY BEFORE MEAL(S)   famotidine 40 MG tablet Commonly known as: PEPCID Take 1 tablet (40 mg total) by mouth at bedtime.   mometasone 0.1 % cream Commonly known as: ELOCON Can apply to eczema once daily if needed.   MULTIVITAMIN PO Take by mouth daily.   pantoprazole 40 MG tablet Commonly known as: PROTONIX Take 1 tablet (40 mg total) by mouth 2 (two) times daily before a meal.    Past Medical History:  Diagnosis Date   Asthma    Depression    Eosinophilic esophagitis    GERD (gastroesophageal reflux disease)    Hiatal hernia    Hypertension    Peptic stricture of esophagus     Past Surgical History:  Procedure Laterality Date   SURGERY SCROTAL / TESTICULAR     blockage   UPPER GASTROINTESTINAL ENDOSCOPY      Review of systems negative except as noted in HPI / PMHx or noted below:  Review of Systems  Constitutional: Negative.   HENT: Negative.    Eyes: Negative.   Respiratory: Negative.    Cardiovascular: Negative.   Gastrointestinal: Negative.  Genitourinary: Negative.   Musculoskeletal: Negative.   Skin: Negative.   Neurological: Negative.   Endo/Heme/Allergies: Negative.   Psychiatric/Behavioral: Negative.       Objective:   Vitals:   12/31/22 1623  BP: (!) 144/90  Pulse: 99  Resp: 18  Temp: 98.4 F (36.9 C)  SpO2: 95%   Height: 5' 7.5" (171.5 cm)  Weight: 208 lb 3.2 oz (94.4 kg)   Physical Exam Constitutional:      Appearance: He is not diaphoretic.  HENT:     Head: Normocephalic.     Right Ear: Tympanic membrane, ear canal and external ear normal.     Left Ear: Tympanic membrane, ear canal and external ear normal.     Nose: Mucosal edema present. No rhinorrhea.     Mouth/Throat:      Pharynx: Uvula midline. No oropharyngeal exudate.  Eyes:     Conjunctiva/sclera: Conjunctivae normal.  Neck:     Thyroid: No thyromegaly.     Trachea: Trachea normal. No tracheal tenderness or tracheal deviation.  Cardiovascular:     Rate and Rhythm: Normal rate and regular rhythm.     Heart sounds: Normal heart sounds, S1 normal and S2 normal. No murmur heard. Pulmonary:     Effort: No respiratory distress.     Breath sounds: Normal breath sounds. No stridor. No wheezing or rales.  Lymphadenopathy:     Head:     Right side of head: No tonsillar adenopathy.     Left side of head: No tonsillar adenopathy.     Cervical: No cervical adenopathy.  Skin:    Findings: No erythema or rash.     Nails: There is no clubbing.  Neurological:     Mental Status: He is alert.     Diagnostics: Spirometry was performed and demonstrated an FEV1 of 3.66 at 105 % of predicted.  Assessment and Plan:   1. Not well controlled moderate persistent asthma   2. Perennial allergic rhinitis   3. Seasonal allergic rhinitis due to pollen   4. Other atopic dermatitis   5. Eosinophilic esophagitis   6. Gastroesophageal reflux disease with esophagitis without hemorrhage    1.  Allergen avoidance measures - Dust mite, cat, dog, pollens  2.  Treat and prevent inflammation:   A. Symbicort 160 - 2 inhalations 1-2 times per day  B. Astepro - 2 sprays each nostril 1-2 times per day  C. Nasacort - 2 sprays each nostril 1-2 times per day  3.  Treat reflux and EOE:  A. Budesonide 1 gm / 1 teaspoon honey 1-2 times per day B. Pantoprazole 40 mg - 1 tablet twice a day B. Famotidine 40 mg - 1 tablet in evening C. Minimize caffeine consumption  4. If needed:   A. Airsupra - 2 inhalations every 4-6 hours   B. OTC antihistamine - Claritin / Zyrtec 10 mg - 1 tablet 1 time per day  C. Mometasone 0.1% cream applied to eczema 1 time per day  5.  Plan for fall flu vaccine  6.  Return to clinic in December 2024 or  earlier if problem  Haadi will now use some swallowed steroids and we will have him increase his dose of Symbicort to twice a day to address inflammation of his esophagus and his respiratory tract and we will see what happens over the course the next several months while utilizing this approach.  Laurette Schimke, MD Allergy / Immunology [facility]

## 2023-01-04 ENCOUNTER — Encounter: Payer: Self-pay | Admitting: Allergy and Immunology

## 2023-01-07 ENCOUNTER — Other Ambulatory Visit: Payer: Self-pay | Admitting: *Deleted

## 2023-01-07 MED ORDER — BUDESONIDE 0.5 MG/2ML IN SUSP: RESPIRATORY_TRACT | 5 refills | Status: DC

## 2023-01-12 ENCOUNTER — Telehealth: Payer: Self-pay | Admitting: Nurse Practitioner

## 2023-01-12 NOTE — Telephone Encounter (Signed)
Inbound call from patient stating he has been having a lot of acid build up. Patient is requesting to discuss a possible medication he could take to help. Requesting a call back to discuss further. Please advise, thank you.

## 2023-01-12 NOTE — Telephone Encounter (Signed)
Called patient to due to complaints of having acid build up and wants to discuss medications he can take to help. Noted in patient's chart, he was prescribed Protonix and Pepcid on 12/31/22 via Dr. Laurette Schimke. No answer via the phone, unable to leave message, phone rings and is followed by a busy signal. Called both the patient and the wife, with the same issue noted on both phones.

## 2023-01-13 NOTE — Telephone Encounter (Signed)
Change to Nexium 40 mg po bid and continue famotidine 40 mg hs Schedule an office appt with me, PG and if we are not available then, due to my approaching retirement, any available physician

## 2023-01-13 NOTE — Telephone Encounter (Signed)
Patient returned previous call via the nurse. Patient states he is continuing to take the Pantoprazole and the Famotidine medications, although they do not help. He also states the Gas-x nor the Gaviscon help with the gaseous or bubbling stomach. He is no longer taking the Dupixent due to his insurance and states he will be calling to see what the issue is or what can be done. Please advise.

## 2023-01-13 NOTE — Telephone Encounter (Signed)
Patient returned your call, please advise. 

## 2023-01-13 NOTE — Telephone Encounter (Signed)
Please confirm his is taking pantoprazole 40 mg bid, famotidine 40 mg hs and following antireflux measures. If not please resume this regimen every day. If he is doing all above and reflux symptoms are still active then let me know.

## 2023-01-14 ENCOUNTER — Other Ambulatory Visit: Payer: Self-pay | Admitting: Nurse Practitioner

## 2023-01-14 NOTE — Telephone Encounter (Signed)
Called patient to inform of Dr. Ardell Isaacs recommendations in reference to the medications. Change to the Nexium 40 mg BID and continue the Famotidine HS. Patient in agreement. Also scheduled appt with Dr. Russella Dar on 01/18/2023 at 2:10p.

## 2023-01-18 ENCOUNTER — Encounter: Payer: Self-pay | Admitting: Gastroenterology

## 2023-01-18 ENCOUNTER — Ambulatory Visit (INDEPENDENT_AMBULATORY_CARE_PROVIDER_SITE_OTHER): Payer: 59 | Admitting: Gastroenterology

## 2023-01-18 VITALS — BP 140/100 | HR 92 | Ht 67.5 in | Wt 207.4 lb

## 2023-01-18 DIAGNOSIS — K2100 Gastro-esophageal reflux disease with esophagitis, without bleeding: Secondary | ICD-10-CM

## 2023-01-18 DIAGNOSIS — K200 Eosinophilic esophagitis: Secondary | ICD-10-CM | POA: Diagnosis not present

## 2023-01-18 MED ORDER — PANTOPRAZOLE SODIUM 40 MG PO TBEC: 40.0000 mg | DELAYED_RELEASE_TABLET | Freq: Two times a day (BID) | ORAL | 1 refills | Status: DC

## 2023-01-18 MED ORDER — FAMOTIDINE 40 MG PO TABS: 40.0000 mg | ORAL_TABLET | Freq: Every day | ORAL | 1 refills | Status: DC

## 2023-01-18 NOTE — Patient Instructions (Signed)
We have sent the following medications to your pharmacy for you to pick up at your convenience: Nexium and famotidine.   Patient advised to avoid spicy, acidic, citrus, chocolate, mints, fruit and fruit juices.  Limit the intake of caffeine, alcohol and Soda.  Don't exercise too soon after eating.  Don't lie down within 3-4 hours of eating.  Elevate the head of your bed.  Call in one month if your symptoms are not better.   The [facility] providers would like to encourage you to use [facility] to communicate with providers for non-urgent requests or questions.  Due to long hold times on the telephone, sending your provider a message by [facility] may be a faster and more efficient way to get a response.  Please allow 48 business hours for a response.  Please remember that this is for non-urgent requests.   Thank you for choosing me and [name].  Dustin Nichols. Pleas Koch., MD., Clementeen Graham

## 2023-01-18 NOTE — Progress Notes (Addendum)
Assessment     GERD with LA Grade B esophagitis and EoE, history of esophageal strictures  LUQ, L costal margin pain - etiology unclear CRC screening, average risk - previously declined colonoscopy   Recommendations    Nexium 40 mg po bid ac, famotidine 40 mg hs, antireflux measures If symptoms not improved consider Flovent and/or repeat EGD Follow antireflux measures Encourage colonosocpy or Cologuard when GERD/EoE symptoms better controlled Contact us if symptoms not significantly improved in 1 month. REV with Dr. Doy Hutching in 2 months   HPI    This is a 52 year old male with GERD with esophagitis and EoE.  He complains of worsening mid substernal pain and left chest pain that he feels is related to his reflux.  Symptoms temporarily improve with Gaviscon.  He was advised to change to Nexium 40 mg po bid, which he started 4 days ago, in place of pantoprazole. Rare, infrequent, non progressive dysphagia.  Occasionally he notes pain in the left upper quadrant of his abdomen along his left costal margin that is not clearly associated with his reflux symptoms.  EGD Feb 2023 - LA Grade B reflux esophagitis with no bleeding.  - Benign-appearing esophageal stenoses. Dilated.  - Small hiatal hernia. - Normal duodenal bulb and second portion of the duodenum.  - No specimens collected   Labs / Imaging       Latest Ref Rng & Units 11/07/2021    2:28 PM  Hepatic Function  Total Protein 6.0 - 8.3 g/dL 7.8   Albumin 3.5 - 5.2 g/dL 4.4   AST 0 - 37 U/L 20   ALT 0 - 53 U/L 23   Alk Phosphatase 39 - 117 U/L 95   Total Bilirubin 0.2 - 1.2 mg/dL 0.7   Bilirubin, Direct 0.0 - 0.3 mg/dL 0.2        Latest Ref Rng & Units 12/20/2019    4:36 PM  CBC  WBC 4.0 - 10.5 K/uL 9.5   Hemoglobin 13.0 - 17.0 g/dL 40.3   Hematocrit 47.4 - 52.0 % 46.5   Platelets 150.0 - 400.0 K/uL 262.0      US Abdomen Limited RUQ (LIVER/GB) CLINICAL DATA:  Abdominal pain.  Elevated LFTs.  EXAM: ULTRASOUND  ABDOMEN LIMITED RIGHT UPPER QUADRANT  COMPARISON:  None Available.  FINDINGS: Gallbladder:  No gallstones or wall thickening visualized. No sonographic Murphy sign noted by sonographer.  Common bile duct:  Diameter: 3 mm  Liver:  No focal lesion identified. Within normal limits in parenchymal echogenicity. Portal vein is patent on color Doppler imaging with normal direction of blood flow towards the liver.  Other: None.  IMPRESSION: No cholelithiasis or sonographic evidence for acute cholecystitis.  Electronically Signed   By: Annia Belt M.D.   On: 11/12/2021 10:42   Current Medications, Allergies, Past Medical History, Past Surgical History, Family History and Social History were reviewed in [employer] record.   Physical Exam: General: Well developed, well nourished, no acute distress Head: Normocephalic and atraumatic Eyes: Sclerae anicteric, EOMI Ears: Normal auditory acuity Mouth: No deformities or lesions noted Lungs: Clear throughout to auscultation Heart: Regular rate and rhythm; No murmurs, rubs or bruits Abdomen: Soft, mild LUQ / L costal margin tenderness and non distended. No masses, hepatosplenomegaly or hernias noted. Normal Bowel sounds Rectal: Not done Musculoskeletal: Symmetrical with no gross deformities  Pulses:  Normal pulses noted Extremities: No edema or deformities noted Neurological: Alert oriented x 4, grossly nonfocal Psychological:  Alert and cooperative. Normal mood and affect   Mabelle Mungin T. Russella Dar, MD 01/18/2023, 2:14 PM

## 2023-02-17 ENCOUNTER — Ambulatory Visit: Payer: Managed Care, Other (non HMO) | Admitting: Allergy and Immunology

## 2023-03-02 ENCOUNTER — Other Ambulatory Visit: Payer: Self-pay | Admitting: Gastroenterology

## 2023-03-22 ENCOUNTER — Ambulatory Visit: Payer: Managed Care, Other (non HMO) | Admitting: Allergy and Immunology

## 2023-04-14 ENCOUNTER — Encounter: Payer: Self-pay | Admitting: Allergy and Immunology

## 2023-04-14 ENCOUNTER — Ambulatory Visit (INDEPENDENT_AMBULATORY_CARE_PROVIDER_SITE_OTHER): Payer: Managed Care, Other (non HMO) | Admitting: Allergy and Immunology

## 2023-04-14 ENCOUNTER — Other Ambulatory Visit: Payer: Self-pay | Admitting: *Deleted

## 2023-04-14 VITALS — BP 148/90 | HR 118 | Resp 16

## 2023-04-14 DIAGNOSIS — J3489 Other specified disorders of nose and nasal sinuses: Secondary | ICD-10-CM

## 2023-04-14 DIAGNOSIS — R1012 Left upper quadrant pain: Secondary | ICD-10-CM

## 2023-04-14 DIAGNOSIS — K200 Eosinophilic esophagitis: Secondary | ICD-10-CM

## 2023-04-14 DIAGNOSIS — L2089 Other atopic dermatitis: Secondary | ICD-10-CM

## 2023-04-14 DIAGNOSIS — J3089 Other allergic rhinitis: Secondary | ICD-10-CM | POA: Diagnosis not present

## 2023-04-14 DIAGNOSIS — K2100 Gastro-esophageal reflux disease with esophagitis, without bleeding: Secondary | ICD-10-CM

## 2023-04-14 DIAGNOSIS — J301 Allergic rhinitis due to pollen: Secondary | ICD-10-CM

## 2023-04-14 DIAGNOSIS — J4540 Moderate persistent asthma, uncomplicated: Secondary | ICD-10-CM | POA: Diagnosis not present

## 2023-04-14 NOTE — Patient Instructions (Addendum)
  1.  Allergen avoidance measures - Dust mite, cat, dog, pollens  2.  Treat and prevent inflammation:   A. Symbicort 160 - 2 inhalations 1-2 times per day  B. Nasacort - 2 sprays each nostril 2 times per day  3.  Treat reflux and EOE:  A. Budesonide 1 gm / 1 teaspoon honey 1-2 times per day B. Pantoprazole 40 mg - 1 tablet twice a day B. Famotidine 40 mg - 1 tablet in evening C. Minimize caffeine consumption  4. If needed:   A. Airsupra - 2 inhalations every 4-6 hours   B. OTC antihistamine - Claritin / Zyrtec 10 mg - 1 tablet 1 time per day  C. Mometasone 0.1% cream applied to eczema 1 time per day  5.  Obtain complete abdominal U/S for left sided abdominal pain  6.  Obtain ENT visit for right side nasal obstruction  7.  Return to clinic in 6 months or earlier if problem  8. Influenza = Tamiflu. Covid = Paxlovid

## 2023-04-14 NOTE — Progress Notes (Signed)
[place] - [place] - [place] - [name] -    Follow-up Note  Referring Provider: No ref. provider found Primary Provider: Patient, No Pcp Per Date of Office Visit: 04/14/2023  Subjective:   Dustin Nichols (DOB: [birth year]) is a 53 y.o. male who [facility] in re-evaluation of the following:  HPI: Dustin Nichols returns to this clinic in evaluation of asthma, allergic rhinoconjunctivitis, atopic dermatitis, eosinophilic esophagitis.  I last saw him in this clinic 31 December 2022.  He has 2 issues that need to be addressed today.  First, he has been having persistent nasal congestion on the right.  This occurs even though he has been using a nasal steroid.  He does not have any anosmia or ugly nasal discharge or recurrent headaches.  He has been having left abdominal pain.  This is a pretty persistent issue and has been there for a few months and may be progressive.  He does not have any associated GI symptoms.  He has not noticed any issues with his urine.  Regarding the issues that we see him for in this clinic, his eosinophilic esophagitis has been under very good control while using a swallowed steroid and a proton pump inhibitor and H2 receptor blocker.  He satisfied with the response that he has received with this approach.  And his asthma has been under excellent control now that he is consistently using his Symbicort twice a day and now he has a rare requirement for a bronchodilator.  And he has had very little issues with his atopic dermatitis and intermittently and rarely uses any topical steroids.  He does not receive the flu vaccine.  Allergies as of 04/14/2023       Reactions   Amoxicillin    REACTION: Rash        Medication List    Airsupra 90-80 MCG/ACT Aero Generic drug: Albuterol-Budesonide Inhale 2 puffs into the lungs every 4 (four) hours as needed.   aspirin EC 81 MG tablet Take 81 mg by mouth daily.  Swallow whole.   Breyna 160-4.5 MCG/ACT inhaler Generic drug: budesonide-formoterol INHALE 2 PUFFS BY MOUTH ONE TO TWO TIMES DAILY TO PREVENT COUGH OR WHEEZE. RINSE, GARGLE, AND SPIT AFTER USE   budesonide 0.5 MG/2ML nebulizer solution Commonly known as: PULMICORT Mix 1 ampule with 1 tsp of honey and swallow 1-2 times daily   cetirizine 10 MG tablet Commonly known as: ZYRTEC Take 10 mg by mouth daily.   dicyclomine 10 MG capsule Commonly known as: BENTYL TAKE 1 CAPSULE BY MOUTH THREE TIMES DAILY BEFORE MEAL(S)   Dupixent 300 MG/2ML prefilled syringe Generic drug: dupilumab Inject 300 mg into the skin every 7 (seven) days.   famotidine 40 MG tablet Commonly known as: PEPCID Take 1 tablet (40 mg total) by mouth at bedtime.   mometasone 0.1 % cream Commonly known as: ELOCON Can apply to eczema once daily if needed.   MULTIVITAMIN PO Take by mouth daily.   pantoprazole 40 MG tablet Commonly known as: PROTONIX Take 1 tablet (40 mg total) by mouth 2 (two) times daily before a meal.    Past Medical History:  Diagnosis Date   Asthma    Depression    Eosinophilic esophagitis    GERD (gastroesophageal reflux disease)    Hiatal hernia    Hypertension    Peptic stricture of esophagus     Past Surgical History:  Procedure Laterality Date   SURGERY SCROTAL / TESTICULAR  blockage   UPPER GASTROINTESTINAL ENDOSCOPY      Review of systems negative except as noted in HPI / PMHx or noted below:  Review of Systems  Constitutional: Negative.   HENT: Negative.    Eyes: Negative.   Respiratory: Negative.    Cardiovascular: Negative.   Gastrointestinal: Negative.   Genitourinary: Negative.   Musculoskeletal: Negative.   Skin: Negative.   Neurological: Negative.   Endo/Heme/Allergies: Negative.   Psychiatric/Behavioral: Negative.       Objective:   Vitals:   04/14/23 1557  BP: (!) 148/90  Pulse: (!) 118  Resp: 16  SpO2: 93%          Physical  Exam Constitutional:      Appearance: He is not diaphoretic.  HENT:     Head: Normocephalic. No right periorbital erythema or left periorbital erythema.     Right Ear: Tympanic membrane, ear canal and external ear normal.     Left Ear: Tympanic membrane, ear canal and external ear normal.     Nose: Septal deviation present. No mucosal edema or rhinorrhea.     Mouth/Throat:     Pharynx: Uvula midline. No oropharyngeal exudate.  Eyes:     General: Lids are normal.     Conjunctiva/sclera: Conjunctivae normal.     Pupils: Pupils are equal, round, and reactive to light.  Neck:     Thyroid: No thyromegaly.     Trachea: Trachea normal. No tracheal tenderness or tracheal deviation.  Cardiovascular:     Rate and Rhythm: Normal rate and regular rhythm.     Heart sounds: Normal heart sounds, S1 normal and S2 normal. No murmur heard. Pulmonary:     Effort: Pulmonary effort is normal. No respiratory distress.     Breath sounds: Normal breath sounds. No stridor. No wheezing or rales.  Chest:     Chest wall: No tenderness.  Abdominal:     General: There is no distension.     Palpations: Abdomen is soft. There is no mass.     Tenderness: There is no abdominal tenderness. There is no guarding or rebound.  Musculoskeletal:        General: No tenderness.  Lymphadenopathy:     Head:     Right side of head: No tonsillar adenopathy.     Left side of head: No tonsillar adenopathy.     Cervical: No cervical adenopathy.  Skin:    Coloration: Skin is not pale.     Findings: No erythema or rash.     Nails: There is no clubbing.  Neurological:     Mental Status: He is alert.     Diagnostics: Spirometry was performed and demonstrated an FEV1 of 3.08 at 89 % of predicted.   Assessment and Plan:   1. Asthma, moderate persistent, well-controlled   2. Perennial allergic rhinitis   3. Seasonal allergic rhinitis due to pollen   4. Other atopic dermatitis   5. Eosinophilic esophagitis   6.  Gastroesophageal reflux disease with esophagitis without hemorrhage   7. Left upper quadrant abdominal pain     1.  Allergen avoidance measures - Dust mite, cat, dog, pollens  2.  Treat and prevent inflammation:   A. Symbicort 160 - 2 inhalations 1-2 times per day  B. Nasacort - 2 sprays each nostril 2 times per day  3.  Treat reflux and EOE:  A. Budesonide 1 gm / 1 teaspoon honey 1-2 times per day B. Pantoprazole 40 mg - 1 tablet twice a  day B. Famotidine 40 mg - 1 tablet in evening C. Minimize caffeine consumption  4. If needed:   A. Airsupra - 2 inhalations every 4-6 hours   B. OTC antihistamine - Claritin / Zyrtec 10 mg - 1 tablet 1 time per day  C. Mometasone 0.1% cream applied to eczema 1 time per day  5.  Obtain complete abdominal U/S for left sided abdominal pain  6.  Obtain ENT visit for right side nasal obstruction  7.  Return to clinic in 6 months or earlier if problem  8. Influenza = Tamiflu. Covid = Paxlovid  Yarnell appears to be doing okay regarding his asthma and his eosinophilic esophagitis but he is having some persistent congestion of his upper airway and we will refer him onto ENT and he has been having the development of progressive left-sided abdominal pain and we will start his evaluation with a complete abdominal ultrasound and based upon the results of that study consider further evaluation.  I will contact him with the results of that study once it is available for review.  Laurette Schimke, MD Allergy / Immunology [facility]

## 2023-04-21 ENCOUNTER — Ambulatory Visit (INDEPENDENT_AMBULATORY_CARE_PROVIDER_SITE_OTHER)
Admission: RE | Admit: 2023-04-21 | Discharge: 2023-04-21 | Disposition: A | Discharge status: Home / Self Care | Payer: 59 | Source: Ambulatory Visit | Attending: Allergy and Immunology | Admitting: Allergy and Immunology

## 2023-04-21 DIAGNOSIS — R1012 Left upper quadrant pain: Secondary | ICD-10-CM | POA: Diagnosis not present

## 2023-04-22 ENCOUNTER — Other Ambulatory Visit: Payer: Self-pay | Admitting: Allergy and Immunology

## 2023-04-22 DIAGNOSIS — R1012 Left upper quadrant pain: Secondary | ICD-10-CM

## 2023-04-22 NOTE — Progress Notes (Signed)
 Referral to GI has been faxed over to:  Theron Flavin, MD [facility]  [address] [place], [place] [address] Phone: [phone] Fax: [phone]   Patient informed

## 2023-10-13 ENCOUNTER — Encounter: Payer: Self-pay | Admitting: Allergy and Immunology

## 2023-10-13 ENCOUNTER — Ambulatory Visit (INDEPENDENT_AMBULATORY_CARE_PROVIDER_SITE_OTHER): Payer: Managed Care, Other (non HMO) | Admitting: Allergy and Immunology

## 2023-10-13 VITALS — BP 138/86 | HR 92 | Resp 18

## 2023-10-13 DIAGNOSIS — J4540 Moderate persistent asthma, uncomplicated: Secondary | ICD-10-CM | POA: Diagnosis not present

## 2023-10-13 DIAGNOSIS — K2100 Gastro-esophageal reflux disease with esophagitis, without bleeding: Secondary | ICD-10-CM

## 2023-10-13 DIAGNOSIS — J3089 Other allergic rhinitis: Secondary | ICD-10-CM | POA: Diagnosis not present

## 2023-10-13 DIAGNOSIS — K200 Eosinophilic esophagitis: Secondary | ICD-10-CM

## 2023-10-13 DIAGNOSIS — J301 Allergic rhinitis due to pollen: Secondary | ICD-10-CM | POA: Diagnosis not present

## 2023-10-13 DIAGNOSIS — J3489 Other specified disorders of nose and nasal sinuses: Secondary | ICD-10-CM

## 2023-10-13 DIAGNOSIS — L2089 Other atopic dermatitis: Secondary | ICD-10-CM

## 2023-10-13 MED ORDER — BUDESONIDE 0.5 MG/2ML IN SUSP: RESPIRATORY_TRACT | 5 refills | Status: AC

## 2023-10-13 MED ORDER — AIRSUPRA 90-80 MCG/ACT IN AERO: 2.0000 | INHALATION_SPRAY | RESPIRATORY_TRACT | 1 refills | Status: AC | PRN

## 2023-10-13 NOTE — Progress Notes (Unsigned)
 [name] - [place] - [name] - [name] - [name]   Follow-up Note  Referring Provider: No ref. provider found Primary Provider: Patient, No Pcp Per Date of Office Visit: 10/13/2023  Subjective:   Dustin Nichols (DOB: 1970/08/23) is a 53 y.o. male who [facility] 10[facility]13/2023 in re-evaluation of the following:  HPI: Milderd returns to this clinic in reevaluation of asthma, allergic rhinoconjunctivitis, atopic dermatitis, eosinophilic esophagitis.  I last saw him in this clinic 14 April 2023.  He believes that his asthma is under excellent control at this point in time.  He rarely uses the short acting bronchodilator while he continues on Symbicort  twice a day.  He still continues to have significant nasal stuffiness especially involving the right side even though he is using a nasal steroid.  His reflux is under pretty good control at this point in time on his current therapy.  He did use swallowed budesonide  for a while and this really helped all of his swallowing issues and at this point he feels as though he does not need to use any of his swallowed budesonide .  He was having left abdominal pain and we obtained an ultrasound of his abdomen which was normal and he subsequently had a colonoscopy which apparently identified diverticulosis and he is using a powder twice a day to treat this issue.  He rarely uses any topical mometasone  for his atopic dermatitis.  He apparently had an acute lower back injury that required the administration of a systemic steroid earlier this spring but fortunately that issue has improved significantly and he does not appear to have any long-term sequela as a result of that injury.  Allergies as of 10/13/2023       Reactions   Amoxicillin    REACTION: Rash        Medication List    Airsupra  90-80 MCG/ACT Aero Generic drug: Albuterol -Budesonide  Inhale 2 puffs into the lungs every 4 (four) hours as needed.    amLODipine 5 MG tablet Commonly known as: NORVASC Take 5 mg by mouth daily.   aspirin EC 81 MG tablet Take 81 mg by mouth daily. Swallow whole.   Breyna  160-4.5 MCG/ACT inhaler Generic drug: budesonide -formoterol  INHALE 2 PUFFS BY MOUTH ONE TO TWO TIMES DAILY TO PREVENT COUGH OR WHEEZE. RINSE, GARGLE, AND SPIT AFTER USE   budesonide  0.5 MG/2ML nebulizer solution Commonly known as: PULMICORT  Mix 1 ampule with 1 tsp of honey and swallow 1-2 times daily   cetirizine 10 MG tablet Commonly known as: ZYRTEC Take 10 mg by mouth daily.   dicyclomine  10 MG capsule Commonly known as: BENTYL  TAKE 1 CAPSULE BY MOUTH THREE TIMES DAILY BEFORE MEAL(S)   famotidine  40 MG tablet Commonly known as: PEPCID  Take 1 tablet (40 mg total) by mouth at bedtime.   mometasone  0.1 % cream Commonly known as: ELOCON  Can apply to eczema once daily if needed.   MULTIVITAMIN PO Take by mouth daily.   pantoprazole  40 MG tablet Commonly known as: PROTONIX  Take 1 tablet (40 mg total) by mouth 2 (two) times daily before a meal.   Ryaltris  665-25 MCG/ACT Susp Generic drug: Olopatadine-Mometasone  2 sprays each nostril 1-2 times per day    Past Medical History:  Diagnosis Date   Asthma    Depression    Eosinophilic esophagitis    GERD (gastroesophageal reflux disease)    Hiatal hernia    Hypertension    Peptic stricture of esophagus  Past Surgical History:  Procedure Laterality Date   SURGERY SCROTAL / TESTICULAR     blockage   UPPER GASTROINTESTINAL ENDOSCOPY      Review of systems negative except as noted in HPI / PMHx or noted below:  Review of Systems  Constitutional: Negative.   HENT: Negative.    Eyes: Negative.   Respiratory: Negative.    Cardiovascular: Negative.   Gastrointestinal: Negative.   Genitourinary: Negative.   Musculoskeletal: Negative.   Skin: Negative.   Neurological: Negative.   Endo/Heme/Allergies: Negative.   Psychiatric/Behavioral: Negative.        Objective:   Vitals:   10/13/23 1603  BP: 138/86  Pulse: 92  Resp: 18  SpO2: 97%          Physical Exam Constitutional:      Appearance: He is not diaphoretic.  HENT:     Head: Normocephalic.     Right Ear: Tympanic membrane, ear canal and external ear normal.     Left Ear: Tympanic membrane, ear canal and external ear normal.     Nose: Nose normal. No mucosal edema or rhinorrhea.     Mouth/Throat:     Pharynx: Uvula midline. No oropharyngeal exudate.  Eyes:     Conjunctiva/sclera: Conjunctivae normal.  Neck:     Thyroid: No thyromegaly.     Trachea: Trachea normal. No tracheal tenderness or tracheal deviation.  Cardiovascular:     Rate and Rhythm: Normal rate and regular rhythm.     Heart sounds: Normal heart sounds, S1 normal and S2 normal. No murmur heard. Pulmonary:     Effort: No respiratory distress.     Breath sounds: Normal breath sounds. No stridor. No wheezing or rales.  Lymphadenopathy:     Head:     Right side of head: No tonsillar adenopathy.     Left side of head: No tonsillar adenopathy.     Cervical: No cervical adenopathy.  Skin:    Findings: No erythema or rash.     Nails: There is no clubbing.  Neurological:     Mental Status: He is alert.     Diagnostics: Results of a complete abdominal ultrasound performed 21 April 2023 identified the following:  The pancreas is mostly obscured by overlying bowel gas.   The liver demonstrates a normal echotexture without intrahepatic biliary dilatation. No masses are visualized. The main portal vein demonstrates normal hepatopedal flow.   The gallbladder demonstrates a polyp measuring 0.3 cm along the anterior wall. Without pericholecystic fluid or wall thickening. There are no gallstones. The common bile duct measures 0.3 cm. The sonographic Murphy's sign is not reported.   The right kidney measures 10.7 cm in length. Renal cortical echotexture is normal. There is no hydronephrosis. There are  no stones. There are no cysts.   The left kidney measures 11.4 cm in length. Renal cortical echotexture is normal. There is no hydronephrosis. There are no stones. There are no cysts.   The spleen measures 9.7 cm in length and demonstrates normal echotexture.   There is no evidence of aneurysm within the visualized segments of the abdominal aorta.   The visualized segments of the IVC are unremarkable.  Assessment and Plan:   1. Asthma, moderate persistent, well-controlled   2. Perennial allergic rhinitis   3. Seasonal allergic rhinitis due to pollen   4. Other atopic dermatitis   5. Eosinophilic esophagitis   6. Gastroesophageal reflux disease with esophagitis without hemorrhage   7. Nasal obstruction    1.  Allergen avoidance measures - Dust mite, cat, dog, pollens  2.  Treat and prevent inflammation:   A. Symbicort  160 - 2 inhalations 1-2 times per day  B. Nasacort - 2 sprays each nostril 2 times per day  3.  Treat reflux and EOE:  A. Pantoprazole  40 mg - 1 tablet twice a day B. Famotidine  40 mg - 1 tablet in evening C. Minimize caffeine consumption D. Can restart Budesonide  1 gm / 1 teaspoon honey 1-2 times per day  4. If needed:   A. Airsupra  - 2 inhalations every 4-6 hours   B. OTC antihistamine - Claritin / Zyrtec 10 mg - 1 tablet 1 time per day  C. Mometasone  0.1% cream applied to eczema 1 time per day  5.  Obtain ENT visit for right side nasal obstruction - [facility]  6.  Return to clinic in 6 months or earlier if problem  7. Influenza = Tamiflu. Covid = Paxlovid  Milderd appears to be doing well regarding his asthma and well regarding his LPR but he has nasal obstruction especially involving the right side and we will have ENT take a look at that issue and he appears to have his EOE under good control without the use of using any swallowed steroids at this point.  I did inform him that he can always restart his swallowed budesonide  should he find that he  develops more problems as he moves forward.  If he does well I will see him back in his clinic in 6 months or earlier if there is a problem.  Camellia Denis, MD Allergy / Immunology [facility]

## 2023-10-13 NOTE — Patient Instructions (Addendum)
  1.  Allergen avoidance measures - Dust mite, cat, dog, pollens  2.  Treat and prevent inflammation:   A. Symbicort  160 - 2 inhalations 1-2 times per day  B. Nasacort - 2 sprays each nostril 2 times per day  3.  Treat reflux and EOE:  A. Pantoprazole  40 mg - 1 tablet twice a day B. Famotidine  40 mg - 1 tablet in evening C. Minimize caffeine consumption D. Can restart Budesonide  1 gm / 1 teaspoon honey 1-2 times per day  4. If needed:   A. Airsupra  - 2 inhalations every 4-6 hours   B. OTC antihistamine - Claritin / Zyrtec 10 mg - 1 tablet 1 time per day  C. Mometasone  0.1% cream applied to eczema 1 time per day  5.  Obtain ENT visit for right side nasal obstruction - [facility]  6.  Return to clinic in 6 months or earlier if problem  7. Influenza = Tamiflu. Covid = Paxlovid

## 2023-10-14 ENCOUNTER — Encounter: Payer: Self-pay | Admitting: Allergy and Immunology

## 2023-10-14 ENCOUNTER — Telehealth: Payer: Self-pay

## 2023-10-14 NOTE — Telephone Encounter (Signed)
*  AA  Pharmacy Patient Advocate Encounter   Received notification from CoverMyMeds that prior authorization for Airsupra  90-80MCG/ACT aerosol  is required/requested.   Insurance verification completed.   The patient is insured through [employer] .   Per test claim:  Trial and failure of TWO preferred options: Albuterol  HFA, Levalbuterol HFA, Symbicort  is preferred by the insurance.  If suggested medication is appropriate, Please send in a new RX and discontinue this one. If not, please advise as to why it's not appropriate so that we may request a Prior Authorization. Please note, some preferred medications may still require a PA.  If the suggested medications have not been trialed and there are no contraindications to their use, the PA will not be submitted, as it will not be approved.   CMM Key: [id number]

## 2023-10-14 NOTE — Telephone Encounter (Signed)
 Patient has been on both Symbicort  and Albuterol .

## 2023-10-18 NOTE — Telephone Encounter (Signed)
**Note De-identified  [place] Obfuscation** Please advise 

## 2023-10-19 NOTE — Telephone Encounter (Signed)
 PA submitted via [employer] Prompt PA  EOC ID: [id number]

## 2023-10-19 NOTE — Telephone Encounter (Signed)
 Information has been sent to clinical pharmacist for appeals review. It may take 5-7 days to prepare the necessary documentation to request the appeal from the insurance.

## 2023-10-19 NOTE — Telephone Encounter (Signed)
 Denied. Denial letter attached in patients media. Submitted to [facility].

## 2023-10-20 ENCOUNTER — Telehealth: Payer: Self-pay | Admitting: Pharmacist

## 2023-10-20 NOTE — Telephone Encounter (Signed)
 Appeal has been submitted for Airsupra . Will advise when response is received, please be advised that most companies may take 30 days to make a decision. Appeal letter and supporting documentation have been faxed to [phone] on 10/20/2023 @11 :29am.  Thank you, Devere Pandy, PharmD Clinical Pharmacist  [place]  Direct Dial: [phone]

## 2023-10-25 NOTE — Telephone Encounter (Signed)
 Insurance has approved the appeal for AirSupra :     Thank you, Devere Pandy, PharmD Clinical Pharmacist  [place]  Direct Dial: [phone]

## 2024-02-09 ENCOUNTER — Other Ambulatory Visit: Payer: Self-pay

## 2024-02-09 MED ORDER — PANTOPRAZOLE SODIUM 40 MG PO TBEC: 40.0000 mg | DELAYED_RELEASE_TABLET | Freq: Two times a day (BID) | ORAL | 1 refills | Status: AC

## 2024-02-09 MED ORDER — FAMOTIDINE 40 MG PO TABS: 40.0000 mg | ORAL_TABLET | Freq: Every day | ORAL | 1 refills | Status: AC

## 2024-02-23 ENCOUNTER — Other Ambulatory Visit: Payer: Self-pay | Admitting: Allergy and Immunology

## 2024-03-19 ENCOUNTER — Other Ambulatory Visit: Payer: Self-pay | Admitting: Allergy and Immunology

## 2024-04-12 ENCOUNTER — Ambulatory Visit: Admitting: Allergy and Immunology

## 2024-05-31 ENCOUNTER — Ambulatory Visit: Admitting: Allergy and Immunology
